# Patient Record
Sex: Female | Born: 2000 | Race: Black or African American | Hispanic: No | Marital: Single | Smoking: Never smoker
Health system: Southern US, Community
[De-identification: ages and names within clinical notes are randomized; demographics above are authoritative.]

---
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
## 2022-02-22 ENCOUNTER — Encounter: Payer: Self-pay | Admitting: Emergency Medicine

## 2022-02-22 ENCOUNTER — Ambulatory Visit
Admission: EM | Admit: 2022-02-22 | Discharge: 2022-02-22 | Disposition: A | Discharge status: Home / Self Care | Payer: Medicaid Other | Attending: Internal Medicine | Admitting: Internal Medicine

## 2022-02-22 DIAGNOSIS — Z113 Encounter for screening for infections with a predominantly sexual mode of transmission: Secondary | ICD-10-CM | POA: Diagnosis not present

## 2022-02-22 DIAGNOSIS — Z3202 Encounter for pregnancy test, result negative: Secondary | ICD-10-CM | POA: Diagnosis not present

## 2022-02-22 DIAGNOSIS — N898 Other specified noninflammatory disorders of vagina: Secondary | ICD-10-CM | POA: Insufficient documentation

## 2022-02-22 LAB — POCT URINALYSIS DIP (MANUAL ENTRY)
Bilirubin, UA: NEGATIVE
Blood, UA: NEGATIVE
Glucose, UA: NEGATIVE mg/dL
Ketones, POC UA: NEGATIVE mg/dL
Nitrite, UA: NEGATIVE
Protein Ur, POC: NEGATIVE mg/dL
Spec Grav, UA: 1.02 (ref 1.010–1.025)
Urobilinogen, UA: 0.2 E.U./dL
pH, UA: 6.5 (ref 5.0–8.0)

## 2022-02-22 LAB — POCT URINE PREGNANCY: Preg Test, Ur: NEGATIVE

## 2022-02-22 NOTE — ED Triage Notes (Signed)
Patient c/o vaginal discharge, requesting STI check.  Requesting bloodwork as well. ?

## 2022-02-22 NOTE — ED Provider Notes (Signed)
?[facility] ? ? ? ?CSN: [id number] ?Arrival date & time: 02/22/22  1437 ? ? ?  ? ?History   ?Chief Complaint ?Chief Complaint  ?Patient presents with  ? Exposure to STD  ? ? ?HPI ?Teresa Alexander is a 21 y.o. female.  ? ?Patient presents for white vaginal discharge that has been present for approximately 2 weeks.  Patient reports that she has a similar discharge around her menstrual cycles.  Last menstrual cycle was approximately 2 weeks ago.  Denies any associated urinary burning, urinary frequency, abdominal pain, fever, back pain, abnormal vaginal bleeding, hematuria.  Patient recently tested positive for gonorrhea at the end of April and was treated with Rocephin IM injection.  Patient is not sure if those symptoms have resolved.  Denies any new confirmed exposure to STD or any new sexual partners.  Patient is requesting blood work for HIV and syphilis as well. ? ? ?Exposure to STD ? ? ?History reviewed. No pertinent past medical history. ? ?There are no problems to display for this patient. ? ? ?History reviewed. No pertinent surgical history. ? ?OB History   ?No obstetric history on file. ?  ? ? ? ?Home Medications   ? ?Prior to Admission medications   ?Medication Sig Start Date End Date Taking? Authorizing Provider  ?fluticasone (FLONASE) 50 MCG/ACT nasal spray Place into the nose. 02/13/22  Yes [provider]  ?loratadine (CLARITIN) 10 MG tablet TAKE 1 TABLET BY MOUTH AT BEDTIME AS NEEDED FOR ALLERGIES. 02/13/22  Yes [provider]  ?silver sulfADIAZINE (SILVADENE) 1 % cream Apply 1 application topically daily. 09/20/20   Wieters, Hallie C, PA-C  ? ? ?Family History ?Family History  ?Problem Relation Age of Onset  ? Healthy Mother   ? ? ?Social History ?Social History  ? ?Tobacco Use  ? Smoking status: Never  ? Smokeless tobacco: Never  ?Vaping Use  ? Vaping Use: Every day  ?Substance Use Topics  ? Alcohol use: Never  ? Drug use: Yes  ?  Types: Marijuana  ? ? ? ?Allergies    ?Patient has no known allergies. ? ? ?Review of Systems ?Review of Systems ?Per HPI ? ?Physical Exam ?Triage Vital Signs ?ED Triage Vitals  ?Enc Vitals Group  ?   BP 02/22/22 1458 112/69  ?   Pulse Rate 02/22/22 1458 (!) 102  ?   Resp 02/22/22 1458 18  ?   Temp 02/22/22 1458 98 ?F (36.7 ?C)  ?   Temp Source 02/22/22 1458 Oral  ?   SpO2 02/22/22 1458 97 %  ?   Weight 02/22/22 1500 153 lb (69.4 kg)  ?   Height --   ?   Head Circumference --   ?   Peak Flow --   ?   Pain Score 02/22/22 1500 0  ?   Pain Loc --   ?   Pain Edu? --   ?   Excl. in GC? --   ? ?No data found. ? ?Updated Vital Signs ?BP 112/69 (BP Location: Left Arm)   Pulse (!) 102   Temp 98 ?F (36.7 ?C) (Oral)   Resp 18   Wt 153 lb (69.4 kg)   LMP 02/07/2022   SpO2 97%  ? ?Visual Acuity ?Right Eye Distance:   ?Left Eye Distance:   ?Bilateral Distance:   ? ?Right Eye Near:   ?Left Eye Near:    ?Bilateral Near:    ? ?Physical Exam ?Constitutional:   ?  General: She is not in acute distress. ?   Appearance: Normal appearance. She is not toxic-appearing or diaphoretic.  ?HENT:  ?   Head: Normocephalic and atraumatic.  ?Eyes:  ?   Extraocular Movements: Extraocular movements intact.  ?   Conjunctiva/sclera: Conjunctivae normal.  ?Cardiovascular:  ?   Rate and Rhythm: Normal rate and regular rhythm.  ?   Pulses: Normal pulses.  ?   Heart sounds: Normal heart sounds.  ?Pulmonary:  ?   Effort: Pulmonary effort is normal. No respiratory distress.  ?   Breath sounds: Normal breath sounds.  ?Abdominal:  ?   General: Abdomen is flat. Bowel sounds are normal. There is no distension.  ?   Palpations: Abdomen is soft.  ?   Tenderness: There is no abdominal tenderness.  ?Genitourinary: ?   Comments: Deferred with shared decision making.  Self swab performed. ?Neurological:  ?   General: No focal deficit present.  ?   Mental Status: She is alert and oriented to person, place, and time. Mental status is at baseline.  ?Psychiatric:     ?   Mood and Affect: Mood normal.      ?   Behavior: Behavior normal.     ?   Thought Content: Thought content normal.     ?   Judgment: Judgment normal.  ? ? ? ?UC Treatments / Results  ?Labs ?(all labs ordered are listed, but only abnormal results are displayed) ?Labs Reviewed  ?POCT URINALYSIS DIP (MANUAL ENTRY) - Abnormal; Notable for the following components:  ?    Result Value  ? Leukocytes, UA Moderate (2+) (*)   ? All other components within normal limits  ?URINE CULTURE  ?HIV ANTIBODY (ROUTINE TESTING W REFLEX)  ?RPR  ?POCT URINE PREGNANCY  ?CERVICOVAGINAL ANCILLARY ONLY  ? ? ?EKG ? ? ?Radiology ?No results found. ? ?Procedures ?Procedures (including critical care time) ? ?Medications Ordered in UC ?Medications - No data to display ? ?Initial Impression / Assessment and Plan / UC Course  ?I have reviewed the triage vital signs and the nursing notes. ? ?Pertinent labs & imaging results that were available during my care of the patient were reviewed by me and considered in my medical decision making (see chart for details). ? ?  ? ?Urinalysis completed by clinical staff and resulted prior to provider evaluation. Do not think it was necessary given no urinary symptoms. Urinalysis showing moderate amount of leukocytes.  Suspect this is related to possible vaginitis.  Do not think UTI is present.  Will await cervicovaginal swab results and urine culture for any further treatment at this time.  RPR and HIV also pending.  Discussed return precautions.  Patient verbalized understanding and was agreeable with plan. ?Final Clinical Impressions(s) / UC Diagnoses  ? ?Final diagnoses:  ?Vaginal discharge  ?Screening examination for venereal disease  ?Urine pregnancy test negative  ? ? ? ?Discharge Instructions   ? ?  ?Your tests are pending.  We will call if they are positive and treat as appropriate.  Please refrain from sexual activity until test results and treatment are complete. ? ? ? ?ED Prescriptions   ?None ?  ? ?PDMP not reviewed this  encounter. ?  ?Teodora Medici, [place] ?02/22/22 1528 ? ?

## 2022-02-22 NOTE — Discharge Instructions (Signed)
Your tests are pending.  We will call if they are positive and treat as appropriate.  Please refrain from sexual activity until test results and treatment are complete. ?

## 2022-02-23 LAB — RPR: RPR Ser Ql: NONREACTIVE

## 2022-02-23 LAB — URINE CULTURE: Culture: NO GROWTH

## 2022-02-23 LAB — HIV ANTIBODY (ROUTINE TESTING W REFLEX): HIV Screen 4th Generation wRfx: NONREACTIVE

## 2022-02-25 ENCOUNTER — Telehealth (HOSPITAL_COMMUNITY): Payer: Self-pay | Admitting: Emergency Medicine

## 2022-02-25 LAB — CERVICOVAGINAL ANCILLARY ONLY
Bacterial Vaginitis (gardnerella): POSITIVE — AB
Candida Glabrata: NEGATIVE
Candida Vaginitis: NEGATIVE
Chlamydia: NEGATIVE
Comment: NEGATIVE
Comment: NEGATIVE
Comment: NEGATIVE
Comment: NEGATIVE
Comment: NEGATIVE
Comment: NORMAL
Neisseria Gonorrhea: NEGATIVE
Trichomonas: NEGATIVE

## 2022-02-25 MED ORDER — METRONIDAZOLE 500 MG PO TABS: 500.0000 mg | ORAL_TABLET | Freq: Two times a day (BID) | ORAL | 0 refills | Status: DC

## 2022-03-10 ENCOUNTER — Emergency Department (HOSPITAL_BASED_OUTPATIENT_CLINIC_OR_DEPARTMENT_OTHER): Payer: Medicaid Other | Admitting: Radiology

## 2022-03-10 ENCOUNTER — Other Ambulatory Visit: Payer: Self-pay

## 2022-03-10 ENCOUNTER — Emergency Department (HOSPITAL_BASED_OUTPATIENT_CLINIC_OR_DEPARTMENT_OTHER): Payer: Medicaid Other

## 2022-03-10 ENCOUNTER — Encounter (HOSPITAL_BASED_OUTPATIENT_CLINIC_OR_DEPARTMENT_OTHER): Payer: Self-pay | Admitting: Emergency Medicine

## 2022-03-10 ENCOUNTER — Emergency Department (HOSPITAL_BASED_OUTPATIENT_CLINIC_OR_DEPARTMENT_OTHER)
Admission: EM | Admit: 2022-03-10 | Discharge: 2022-03-10 | Disposition: A | Discharge status: Home / Self Care | Payer: Medicaid Other | Attending: Emergency Medicine | Admitting: Emergency Medicine

## 2022-03-10 DIAGNOSIS — R079 Chest pain, unspecified: Secondary | ICD-10-CM

## 2022-03-10 DIAGNOSIS — M542 Cervicalgia: Secondary | ICD-10-CM | POA: Insufficient documentation

## 2022-03-10 DIAGNOSIS — R0789 Other chest pain: Secondary | ICD-10-CM | POA: Diagnosis not present

## 2022-03-10 DIAGNOSIS — R519 Headache, unspecified: Secondary | ICD-10-CM | POA: Insufficient documentation

## 2022-03-10 DIAGNOSIS — Y92410 Unspecified street and highway as the place of occurrence of the external cause: Secondary | ICD-10-CM | POA: Insufficient documentation

## 2022-03-10 MED ORDER — OXYCODONE-ACETAMINOPHEN 5-325 MG PO TABS
1.0000 | ORAL_TABLET | Freq: Once | ORAL | Status: AC
  Administered 2022-03-10: 1 via ORAL
  Filled 2022-03-10: qty 1

## 2022-03-10 NOTE — ED Notes (Signed)
Pt aware urine sample needed, urine cup provided.

## 2022-03-10 NOTE — ED Notes (Signed)
Called for Pt. In waiting room. Per Registration Pt went to go sit in her Car. Pt is not seen in waiting room by Staff.

## 2022-03-10 NOTE — ED Triage Notes (Signed)
Unsure if LOC
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
## 2022-03-10 NOTE — Discharge Instructions (Addendum)
If you develop new or worsening headache, trouble breathing, chest pain, vomiting, or any other new/concerning symptoms then return to the ER for evaluation.

## 2022-03-10 NOTE — ED Provider Notes (Signed)
CT head and C-spine images viewed by myself, no fracture or dislocation or head bleed.  Chest x-ray is also clear.  At this point this seems to be more superficial injuries and likely a concussion.  She is hemodynamically stable.  Stable for discharge.  Given return precautions.   Sherwood Gambler, MD 03/10/22 [phone]
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
## 2022-03-27 ENCOUNTER — Ambulatory Visit: Payer: Medicaid Other | Admitting: Obstetrics

## 2022-04-25 ENCOUNTER — Ambulatory Visit: Payer: Medicaid Other

## 2022-04-29 ENCOUNTER — Ambulatory Visit (HOSPITAL_COMMUNITY): Payer: Medicaid Other

## 2022-05-06 ENCOUNTER — Ambulatory Visit
Admission: EM | Admit: 2022-05-06 | Discharge: 2022-05-06 | Disposition: A | Discharge status: Home / Self Care | Payer: Medicaid Other | Attending: Emergency Medicine | Admitting: Emergency Medicine

## 2022-05-06 DIAGNOSIS — R21 Rash and other nonspecific skin eruption: Secondary | ICD-10-CM | POA: Diagnosis present

## 2022-05-06 NOTE — ED Triage Notes (Signed)
Pt presents today with co of new onset of vaginal rash for 3 days pt st she is unsure if related to shaving as some have gone away.  Pt request sti check recent period no concern for pregnancy

## 2022-05-06 NOTE — Discharge Instructions (Signed)
Your vaginal swab results will post to MyChart in 2 to 3 days. Please exfoliate areas where you shave twice a week using a course washcloth.

## 2022-05-06 NOTE — ED Provider Notes (Signed)
Patient Contact: 12:54 PM (approximate)   History   vaginal rash   HPI  Teresa Alexander is a 21 y.o. female presents to the urgent care with concern for possible vaginal rash.  Patient states that she noticed small bumps after she was shaving.  She has had recent unprotected sex with a new partner.  No changes in vaginal discharge or deep dyspareunia.  No fever or chills.      Physical Exam   Triage Vital Signs: ED Triage Vitals  Enc Vitals Group     BP 05/06/22 1246 124/73     Pulse Rate 05/06/22 1246 90     Resp 05/06/22 1246 18     Temp 05/06/22 1246 97.8 F (36.6 C)     Temp src --      SpO2 05/06/22 1246 98 %     Weight --      Height --      Head Circumference --      Peak Flow --      Pain Score 05/06/22 1245 2     Pain Loc --      Pain Edu? --      Excl. in GC? --     Most recent vital signs: Vitals:   05/06/22 1246  BP: 124/73  Pulse: 90  Resp: 18  Temp: 97.8 F (36.6 C)  SpO2: 98%     General: Alert and in no acute distress. Eyes:  PERRL. EOMI. Head: No acute traumatic findings ENT:      Ears:       Nose: No congestion/rhinnorhea.      Mouth/Throat: Mucous membranes are moist. Neck: No stridor. No cervical spine tenderness to palpation. Cardiovascular:  Good peripheral perfusion Respiratory: Normal respiratory effort without tachypnea or retractions. Lungs CTAB. Good air entry to the bases with no decreased or absent breath sounds. Gastrointestinal: Bowel sounds 4 quadrants. Soft and nontender to palpation. No guarding or rigidity. No palpable masses. No distention. No CVA tenderness. Musculoskeletal: Full range of motion to all extremities.  Neurologic:  No gross focal neurologic deficits are appreciated.  Skin patient has scattered papular, flesh-colored eruptions along labia majora.    ED Results / Procedures / Treatments   Labs (all labs ordered are listed, but only abnormal results are displayed) Labs Reviewed   CERVICOVAGINAL ANCILLARY ONLY        PROCEDURES:  Critical Care performed: No  Procedures   MEDICATIONS ORDERED IN ED: Medications - No data to display   IMPRESSION / MDM / ASSESSMENT AND PLAN / ED COURSE  I reviewed the triage vital signs and the nursing notes.                              Assessment and plan Vaginal rash 21 year old female presents to the urgent care with scattered papular rash along labia majora after shaving.  Suspect folliculitis and recommended exfoliating 2 times a week prior to shaving.  Vaginal swab in process at this time.  Return precautions were given to return with new or worsening symptoms.     FINAL CLINICAL IMPRESSION(S) / ED DIAGNOSES   Final diagnoses:  Rash and nonspecific skin eruption     Rx / DC Orders   ED Discharge Orders     None        Note:  This document was prepared using Dragon voice recognition software and may include unintentional dictation errors.  Pia Mau [place], [place] 05/06/22 1256

## 2022-05-07 ENCOUNTER — Telehealth (HOSPITAL_COMMUNITY): Payer: Self-pay | Admitting: Emergency Medicine

## 2022-05-07 LAB — CERVICOVAGINAL ANCILLARY ONLY
Bacterial Vaginitis (gardnerella): POSITIVE — AB
Candida Glabrata: NEGATIVE
Candida Vaginitis: POSITIVE — AB
Chlamydia: NEGATIVE
Comment: NEGATIVE
Comment: NEGATIVE
Comment: NEGATIVE
Comment: NEGATIVE
Comment: NEGATIVE
Comment: NORMAL
Neisseria Gonorrhea: NEGATIVE
Trichomonas: NEGATIVE

## 2022-05-07 MED ORDER — METRONIDAZOLE 500 MG PO TABS: 500.0000 mg | ORAL_TABLET | Freq: Two times a day (BID) | ORAL | 0 refills | Status: DC

## 2022-05-07 MED ORDER — FLUCONAZOLE 150 MG PO TABS: 150.0000 mg | ORAL_TABLET | Freq: Once | ORAL | 0 refills | Status: AC

## 2022-06-13 ENCOUNTER — Ambulatory Visit (HOSPITAL_COMMUNITY)
Admission: EM | Admit: 2022-06-13 | Discharge: 2022-06-13 | Disposition: A | Discharge status: Home / Self Care | Payer: Medicaid Other | Attending: Internal Medicine | Admitting: Internal Medicine

## 2022-06-13 ENCOUNTER — Encounter (HOSPITAL_COMMUNITY): Payer: Self-pay | Admitting: Emergency Medicine

## 2022-06-13 DIAGNOSIS — N760 Acute vaginitis: Secondary | ICD-10-CM

## 2022-06-13 LAB — POC URINE PREG, ED: Preg Test, Ur: NEGATIVE

## 2022-06-13 MED ORDER — METRONIDAZOLE 500 MG PO TABS: 500.0000 mg | ORAL_TABLET | Freq: Two times a day (BID) | ORAL | 0 refills | Status: DC

## 2022-06-13 NOTE — ED Triage Notes (Addendum)
Patient c/o vaginal discharge x 1 week.   Patient denies dysuria, ABD pain, pelvic pain, or vaginal itching.   Patient endorses abnormal vaginal odor and " white to yellow " color".   Patient endorses onset of symptoms began after sexual intercourse.   Patient denies any known STD exposure.   Patient hasn't taken any medications for symptos.   Patient took a Plan B pill " at the beginning of the month" and is unsure of when last period was.

## 2022-06-13 NOTE — Discharge Instructions (Addendum)
Please take medications as prescribed We will call you with recommendations if labs are abnormal Safe sex practices recommended Return to urgent care if you have worsening symptoms.

## 2022-06-14 ENCOUNTER — Telehealth (HOSPITAL_COMMUNITY): Payer: Self-pay | Admitting: Emergency Medicine

## 2022-06-14 LAB — CERVICOVAGINAL ANCILLARY ONLY
Bacterial Vaginitis (gardnerella): POSITIVE — AB
Candida Glabrata: NEGATIVE
Candida Vaginitis: POSITIVE — AB
Chlamydia: NEGATIVE
Comment: NEGATIVE
Comment: NEGATIVE
Comment: NEGATIVE
Comment: NEGATIVE
Comment: NEGATIVE
Comment: NORMAL
Neisseria Gonorrhea: NEGATIVE
Trichomonas: NEGATIVE

## 2022-06-14 MED ORDER — FLUCONAZOLE 150 MG PO TABS: 150.0000 mg | ORAL_TABLET | Freq: Once | ORAL | 0 refills | Status: AC

## 2022-06-16 NOTE — ED Provider Notes (Signed)
[facility]    CSN: [id number] Arrival date & time: 06/13/22  1258      History   Chief Complaint Chief Complaint  Patient presents with   Vaginal Discharge    HPI Teresa Alexander is a 21 y.o. female comes to urgent care with 1 week history of yellowish vaginal discharge.  Patient says symptoms started insidiously and has been persistent.  No dysuria urgency or frequency.  No nausea or vomiting.  Patient has no abdominal pain.  She denies deep or superficial dyspareunia.  No known STD exposures.  No known exposure to STD.  HPI  History reviewed. No pertinent past medical history.  There are no problems to display for this patient.   History reviewed. No pertinent surgical history.  OB History   No obstetric history on file.      Home Medications    Prior to Admission medications   Medication Sig Start Date End Date Taking? Authorizing Provider  metroNIDAZOLE (FLAGYL) 500 MG tablet Take 1 tablet (500 mg total) by mouth 2 (two) times daily. 06/13/22  Yes Marion Rosenberry, Britta Mccreedy, MD    Family History Family History  Problem Relation Age of Onset   Healthy Mother     Social History Social History   Tobacco Use   Smoking status: Never   Smokeless tobacco: Never  Vaping Use   Vaping Use: Every day  Substance Use Topics   Alcohol use: Never   Drug use: Yes    Types: Marijuana     Allergies   Patient has no known allergies.   Review of Systems Review of Systems  Respiratory: Negative.    Gastrointestinal: Negative.   Genitourinary:  Positive for vaginal discharge. Negative for dysuria and urgency.     Physical Exam Triage Vital Signs ED Triage Vitals  Enc Vitals Group     BP 06/13/22 1327 112/70     Pulse Rate 06/13/22 1327 74     Resp 06/13/22 1327 12     Temp 06/13/22 1327 98.5 F (36.9 C)     Temp Source 06/13/22 1327 Oral     SpO2 06/13/22 1327 100 %     Weight --      Height --      Head Circumference --      Peak Flow --       Pain Score 06/13/22 1331 0     Pain Loc --      Pain Edu? --      Excl. in GC? --    No data found.  Updated Vital Signs BP 112/70 (BP Location: Left Arm)   Pulse 74   Temp 98.5 F (36.9 C) (Oral)   Resp 12   LMP  (LMP Unknown)   SpO2 100%   Visual Acuity Right Eye Distance:   Left Eye Distance:   Bilateral Distance:    Right Eye Near:   Left Eye Near:    Bilateral Near:     Physical Exam Vitals and nursing note reviewed.  Constitutional:      General: She is not in acute distress.    Appearance: She is not ill-appearing.  Cardiovascular:     Rate and Rhythm: Normal rate and regular rhythm.  Abdominal:     General: Bowel sounds are normal.     Palpations: Abdomen is soft.  Neurological:     Mental Status: She is alert.      UC Treatments / Results  Labs (all labs ordered are  listed, but only abnormal results are displayed) Labs Reviewed  CERVICOVAGINAL ANCILLARY ONLY - Abnormal; Notable for the following components:      Result Value   Bacterial Vaginitis (gardnerella) Positive (*)    Candida Vaginitis Positive (*)    All other components within normal limits  POC URINE PREG, ED    EKG   Radiology No results found.  Procedures Procedures (including critical care time)  Medications Ordered in UC Medications - No data to display  Initial Impression / Assessment and Plan / UC Course  I have reviewed the triage vital signs and the nursing notes.  Pertinent labs & imaging results that were available during my care of the patient were reviewed by me and considered in my medical decision making (see chart for details).     1.  Acute vaginitis: Flagyl 500 mg twice daily for 7 days Cervicovaginal swab for GC/chlamydia/bacterial vaginosis/ We will call patient with recommendations if labs are abnormal questions given Return precautions given. Final Clinical Impressions(s) / UC Diagnoses   Final diagnoses:  Acute vaginitis     Discharge  Instructions      Please take medications as prescribed We will call you with recommendations if labs are abnormal Safe sex practices recommended Return to urgent care if you have worsening symptoms.   ED Prescriptions     Medication Sig Dispense Auth. Provider   metroNIDAZOLE (FLAGYL) 500 MG tablet Take 1 tablet (500 mg total) by mouth 2 (two) times daily. 14 tablet Brylie Sneath, Britta Mccreedy, MD      PDMP not reviewed this encounter.   Merrilee Jansky, MD 06/16/22 1037

## 2022-06-27 ENCOUNTER — Ambulatory Visit: Payer: Medicaid Other | Admitting: Obstetrics and Gynecology
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
## 2022-09-14 ENCOUNTER — Ambulatory Visit: Payer: Medicaid Other

## 2023-05-13 ENCOUNTER — Inpatient Hospital Stay (HOSPITAL_COMMUNITY)
Admission: RE | Admit: 2023-05-13 | Discharge: 2023-05-13 | Disposition: A | Discharge status: Home / Self Care | Payer: Self-pay | Source: Ambulatory Visit | Attending: Family Medicine | Admitting: Family Medicine

## 2023-05-22 ENCOUNTER — Encounter (HOSPITAL_COMMUNITY): Payer: Self-pay

## 2023-05-22 ENCOUNTER — Ambulatory Visit (HOSPITAL_COMMUNITY)
Admission: RE | Admit: 2023-05-22 | Discharge: 2023-05-22 | Disposition: A | Discharge status: Home / Self Care | Payer: Medicaid Other | Source: Ambulatory Visit | Attending: Family Medicine | Admitting: Family Medicine

## 2023-05-22 VITALS — BP 127/82 | HR 81 | Temp 98.0°F | Resp 16

## 2023-05-22 DIAGNOSIS — Z113 Encounter for screening for infections with a predominantly sexual mode of transmission: Secondary | ICD-10-CM

## 2023-05-22 DIAGNOSIS — N898 Other specified noninflammatory disorders of vagina: Secondary | ICD-10-CM | POA: Diagnosis present

## 2023-05-22 MED ORDER — METRONIDAZOLE 500 MG PO TABS: 500.0000 mg | ORAL_TABLET | Freq: Two times a day (BID) | ORAL | 0 refills | Status: DC

## 2023-05-22 MED ORDER — FLUCONAZOLE 150 MG PO TABS: ORAL_TABLET | ORAL | 0 refills | Status: DC

## 2023-05-22 NOTE — ED Provider Notes (Signed)
[facility]   [id number] 05/22/23 Arrival Time: 1035  ASSESSMENT & PLAN:  1. Vaginal discharge   2. Screening for STDs (sexually transmitted diseases)    Will tx empirically for yeast/BV; reports h/o. Meds ordered this encounter  Medications   metroNIDAZOLE (FLAGYL) 500 MG tablet    Sig: Take 1 tablet (500 mg total) by mouth 2 (two) times daily.    Dispense:  14 tablet    Refill:  0   fluconazole (DIFLUCAN) 150 MG tablet    Sig: Take one tablet by mouth as a single dose. May repeat in 3 days if symptoms persist.    Dispense:  2 tablet    Refill:  0      Discharge Instructions      We have sent testing for sexually transmitted infections. We will notify you of any positive results once they are received. If required, we will prescribe any medications you might need.  Please refrain from all sexual activity for at least the next seven days.     Without s/s of PID.  Labs Reviewed  CERVICOVAGINAL ANCILLARY ONLY   Will notify of any positive results. Instructed to refrain from sexual activity for at least seven days.  Reviewed expectations re: course of current medical issues. Questions answered. Outlined signs and symptoms indicating need for more acute intervention. Patient verbalized understanding. After Visit Summary given.   SUBJECTIVE:  Teresa Alexander is a 22 y.o. female who presents with complaint of vaginal discharge. Pt states she has had a watery discharge x 1 week no other sx. She would like STI testing. States she uses condoms sometimes One female sexual partner.  Patient's last menstrual period was 05/11/2023 (approximate).   OBJECTIVE:  Vitals:   05/22/23 1051  BP: 127/82  Pulse: 81  Resp: 16  Temp: 98 F (36.7 C)  TempSrc: Oral  SpO2: 98%     General appearance: alert, cooperative, appears stated age and no distress Lungs: unlabored respirations; speaks full sentences without difficulty Back: no CVA tenderness; FROM at  waist Abdomen: soft, non-tender GU: deferred Skin: warm and dry Psychological: alert and cooperative; normal mood and affect.   Labs Reviewed  CERVICOVAGINAL ANCILLARY ONLY    No Known Allergies  History reviewed. No pertinent past medical history. Family History  Problem Relation Age of Onset   Healthy Mother    Social History   Socioeconomic History   Marital status: Single    Spouse name: Not on file   Number of children: Not on file   Years of education: Not on file   Highest education level: Not on file  Occupational History   Not on file  Tobacco Use   Smoking status: Never   Smokeless tobacco: Never  Vaping Use   Vaping status: Every Day  Substance and Sexual Activity   Alcohol use: Never   Drug use: Yes    Types: Marijuana   Sexual activity: Yes    Birth control/protection: Condom    Comment: sometimes  Other Topics Concern   Not on file  Social History Narrative   Not on file   Social Determinants of Health   Financial Resource Strain: Not on file  Food Insecurity: Not on file  Transportation Needs: Not on file  Physical Activity: Not on file  Stress: Not on file  Social Connections: Not on file  Intimate Partner Violence: Not on file           Mardella Layman, MD 05/22/23 1122

## 2023-05-22 NOTE — ED Triage Notes (Addendum)
Pt states she has had a watery discharge x 1 week no other sx. She would like STI testing. States she uses condoms sometimes

## 2023-05-22 NOTE — Discharge Instructions (Signed)
We have sent testing for sexually transmitted infections. We will notify you of any positive results once they are received. If required, we will prescribe any medications you might need.  Please refrain from all sexual activity for at least the next seven days.

## 2023-05-23 ENCOUNTER — Telehealth: Payer: Self-pay

## 2023-05-23 MED ORDER — DOXYCYCLINE HYCLATE 100 MG PO CAPS: 100.0000 mg | ORAL_CAPSULE | Freq: Two times a day (BID) | ORAL | 0 refills | Status: DC

## 2023-05-23 NOTE — Telephone Encounter (Signed)
Contacted patient by phone.  Verified identity using two identifiers.  Provided positive result.  Reviewed safe sex practices, notifying partners, and refraining from sexual activities for 7 days from time of treatment.  Patient verified understanding, all questions answered.   Pt requires tx with Doxycycline. Reviewed with patient, verified pharmacy, prescription sent.

## 2023-06-09 ENCOUNTER — Ambulatory Visit (HOSPITAL_COMMUNITY): Payer: Medicaid Other

## 2023-06-10 ENCOUNTER — Ambulatory Visit (HOSPITAL_COMMUNITY): Payer: Medicaid Other

## 2023-06-18 ENCOUNTER — Encounter (HOSPITAL_COMMUNITY): Payer: Self-pay | Admitting: Emergency Medicine

## 2023-06-18 ENCOUNTER — Ambulatory Visit (HOSPITAL_COMMUNITY)
Admission: EM | Admit: 2023-06-18 | Discharge: 2023-06-18 | Disposition: A | Discharge status: Home / Self Care | Payer: Medicaid Other | Attending: Family Medicine | Admitting: Family Medicine

## 2023-06-18 DIAGNOSIS — Z113 Encounter for screening for infections with a predominantly sexual mode of transmission: Secondary | ICD-10-CM

## 2023-06-18 NOTE — ED Triage Notes (Signed)
Patient states that she tested positive for chlamydia earlier this month, completed course of medication.  Partner was treated.  Patient would like to be retested to make sure everything is cleared up.

## 2023-06-19 NOTE — ED Provider Notes (Signed)
  [facility]   [id number] 06/18/23 Arrival Time: 1734  ASSESSMENT & PLAN:  1. Screening for STDs (sexually transmitted diseases)    Without s/s of PID. No empiric tx.  Labs Reviewed  CERVICOVAGINAL ANCILLARY ONLY   Will notify of any positive results. Instructed to refrain from sexual activity for at least seven days.  Reviewed expectations re: course of current medical issues. Questions answered. Outlined signs and symptoms indicating need for more acute intervention. Patient verbalized understanding. After Visit Summary given.   SUBJECTIVE:  Teresa Alexander is a 22 y.o. female who reports that she tested positive for chlamydia earlier this month, completed course of medication.  Partner was treated.  Patient would like to be retested to make sure everything is cleared up.  Patient's last menstrual period was 06/14/2023 (approximate).   OBJECTIVE:  Vitals:   06/18/23 1825 06/18/23 1826  BP: 126/83   Pulse: (!) 59   Resp: 16   Temp: 98.4 F (36.9 C)   TempSrc: Oral   SpO2: 98%   Weight:  74.8 kg  Height:  5\' 5"  (1.651 m)    General appearance: alert, cooperative, appears stated age and no distress Lungs: unlabored respirations; speaks full sentences without difficulty Back: no CVA tenderness; FROM at waist Abdomen: soft, non-tender GU: deferred Skin: warm and dry Psychological: alert and cooperative; normal mood and affect.  Labs Reviewed  CERVICOVAGINAL ANCILLARY ONLY    No Known Allergies  History reviewed. No pertinent past medical history. Family History  Problem Relation Age of Onset   Healthy Mother    Social History   Socioeconomic History   Marital status: Single    Spouse name: Not on file   Number of children: Not on file   Years of education: Not on file   Highest education level: Not on file  Occupational History   Not on file  Tobacco Use   Smoking status: Never   Smokeless tobacco: Never  Vaping Use   Vaping  status: Every Day  Substance and Sexual Activity   Alcohol use: Never   Drug use: Yes    Types: Marijuana   Sexual activity: Yes    Birth control/protection: Condom    Comment: sometimes  Other Topics Concern   Not on file  Social History Narrative   Not on file   Social Determinants of Health   Financial Resource Strain: Not on file  Food Insecurity: Not on file  Transportation Needs: Not on file  Physical Activity: Not on file  Stress: Not on file  Social Connections: Not on file  Intimate Partner Violence: Not on file           Mardella Layman, MD 06/19/23 1007

## 2023-06-20 ENCOUNTER — Telehealth: Payer: Self-pay

## 2023-06-20 MED ORDER — METRONIDAZOLE 500 MG PO TABS: 500.0000 mg | ORAL_TABLET | Freq: Two times a day (BID) | ORAL | 0 refills | Status: AC

## 2023-06-20 NOTE — Telephone Encounter (Signed)
Per protocol, pt requires tx with metronidazole. Reviewed with patient, verified pharmacy, prescription sent.

## 2023-06-26 LAB — CERVICOVAGINAL ANCILLARY ONLY
Bacterial Vaginitis (gardnerella): POSITIVE — AB
Candida Glabrata: NEGATIVE
Candida Vaginitis: NEGATIVE
Chlamydia: NEGATIVE
Comment: NEGATIVE
Comment: NEGATIVE
Comment: NEGATIVE
Comment: NEGATIVE
Comment: NEGATIVE
Comment: NORMAL
Neisseria Gonorrhea: NEGATIVE
Trichomonas: NEGATIVE

## 2023-07-02 ENCOUNTER — Other Ambulatory Visit: Payer: Self-pay

## 2023-07-02 ENCOUNTER — Ambulatory Visit (HOSPITAL_COMMUNITY)
Admission: RE | Admit: 2023-07-02 | Discharge: 2023-07-02 | Disposition: A | Discharge status: Home / Self Care | Payer: Medicaid Other | Source: Ambulatory Visit | Attending: Emergency Medicine | Admitting: Emergency Medicine

## 2023-07-02 ENCOUNTER — Encounter (HOSPITAL_COMMUNITY): Payer: Self-pay

## 2023-07-02 VITALS — BP 118/80 | HR 66 | Temp 98.3°F | Resp 18

## 2023-07-02 DIAGNOSIS — Z113 Encounter for screening for infections with a predominantly sexual mode of transmission: Secondary | ICD-10-CM | POA: Diagnosis present

## 2023-07-02 DIAGNOSIS — N898 Other specified noninflammatory disorders of vagina: Secondary | ICD-10-CM | POA: Insufficient documentation

## 2023-07-02 LAB — POCT URINE PREGNANCY: Preg Test, Ur: NEGATIVE

## 2023-07-02 NOTE — ED Triage Notes (Signed)
Pt has had a Vag discharge for one week.

## 2023-07-02 NOTE — ED Provider Notes (Signed)
[facility]    CSN: [id number] Arrival date & time: 07/02/23  1116      History   Chief Complaint Chief Complaint  Patient presents with   SEXUALLY TRANSMITTED DISEASE   Vaginal Discharge    HPI Teresa Alexander is a 22 y.o. female.  Here with 1 week history of vaginal discharge Denies odor or itching  Unknown if exposure to STD. Recently had chlamydia. Reports frequent BV Denies abdominal pain, rash or lesions, dysuria  LMP 8/24  History reviewed. No pertinent past medical history.  There are no problems to display for this patient.   History reviewed. No pertinent surgical history.  OB History   No obstetric history on file.      Home Medications    Prior to Admission medications   Not on File    Family History Family History  Problem Relation Age of Onset   Healthy Mother     Social History Social History   Tobacco Use   Smoking status: Never   Smokeless tobacco: Never  Vaping Use   Vaping status: Every Day  Substance Use Topics   Alcohol use: Never   Drug use: Yes    Types: Marijuana     Allergies   Patient has no known allergies.   Review of Systems Review of Systems  Genitourinary:  Positive for vaginal discharge.   Per HPI  Physical Exam Triage Vital Signs ED Triage Vitals  Encounter Vitals Group     BP 07/02/23 1214 118/80     Systolic BP Percentile --      Diastolic BP Percentile --      Pulse Rate 07/02/23 1214 66     Resp 07/02/23 1214 18     Temp 07/02/23 1214 98.3 F (36.8 C)     Temp src --      SpO2 07/02/23 1214 98 %     Weight --      Height --      Head Circumference --      Peak Flow --      Pain Score 07/02/23 1212 0     Pain Loc --      Pain Education --      Exclude from Growth Chart --    No data found.  Updated Vital Signs BP 118/80   Pulse 66   Temp 98.3 F (36.8 C)   Resp 18   LMP 06/14/2023 (Approximate)   SpO2 98%   Visual Acuity Right Eye Distance:   Left Eye  Distance:   Bilateral Distance:    Right Eye Near:   Left Eye Near:    Bilateral Near:     Physical Exam Vitals and nursing note reviewed.  Constitutional:      General: She is not in acute distress.    Appearance: Normal appearance.  Cardiovascular:     Rate and Rhythm: Normal rate and regular rhythm.     Heart sounds: Normal heart sounds.  Pulmonary:     Effort: Pulmonary effort is normal.     Breath sounds: Normal breath sounds.  Neurological:     Mental Status: She is alert and oriented to person, place, and time.      UC Treatments / Results  Labs (all labs ordered are listed, but only abnormal results are displayed) Labs Reviewed  POCT URINE PREGNANCY  CERVICOVAGINAL ANCILLARY ONLY    EKG   Radiology No results found.  Procedures Procedures (including critical care time)  Medications  Ordered in UC Medications - No data to display  Initial Impression / Assessment and Plan / UC Course  I have reviewed the triage vital signs and the nursing notes.  Pertinent labs & imaging results that were available during my care of the patient were reviewed by me and considered in my medical decision making (see chart for details).  UPT negative Cytology swab pending Provided with OB/GYN clinic for follow-up. All questions answered. Work note provided. Return if needed  Final Clinical Impressions(s) / UC Diagnoses   Final diagnoses:  Vaginal discharge  Screen for STD (sexually transmitted disease)     Discharge Instructions      We will call you if anything on your swab returns positive. You can also see these results on MyChart once you set up an account. Please abstain from sexual intercourse until your results return.  Call the OB/GYN clinic to make an appointment for follow up     ED Prescriptions   None    PDMP not reviewed this encounter.   Marlow Baars, Cordelia Poche 07/02/23 1314

## 2023-07-02 NOTE — Discharge Instructions (Addendum)
We will call you if anything on your swab returns positive. You can also see these results on MyChart once you set up an account. Please abstain from sexual intercourse until your results return.  Call the OB/GYN clinic to make an appointment for follow up

## 2023-07-03 LAB — CERVICOVAGINAL ANCILLARY ONLY
Bacterial Vaginitis (gardnerella): POSITIVE — AB
Candida Glabrata: NEGATIVE
Candida Vaginitis: NEGATIVE
Chlamydia: NEGATIVE
Comment: NEGATIVE
Comment: NEGATIVE
Comment: NEGATIVE
Comment: NEGATIVE
Comment: NEGATIVE
Comment: NORMAL
Neisseria Gonorrhea: NEGATIVE
Trichomonas: NEGATIVE

## 2023-07-04 ENCOUNTER — Encounter (HOSPITAL_COMMUNITY): Payer: Self-pay

## 2023-07-04 ENCOUNTER — Ambulatory Visit (HOSPITAL_COMMUNITY)
Admission: RE | Admit: 2023-07-04 | Discharge: 2023-07-04 | Disposition: A | Discharge status: Home / Self Care | Payer: Medicaid Other | Source: Ambulatory Visit | Attending: Emergency Medicine | Admitting: Emergency Medicine

## 2023-07-04 VITALS — BP 117/77 | HR 84 | Temp 98.1°F | Resp 16

## 2023-07-04 DIAGNOSIS — R350 Frequency of micturition: Secondary | ICD-10-CM | POA: Diagnosis not present

## 2023-07-04 DIAGNOSIS — N760 Acute vaginitis: Secondary | ICD-10-CM | POA: Diagnosis present

## 2023-07-04 DIAGNOSIS — B9689 Other specified bacterial agents as the cause of diseases classified elsewhere: Secondary | ICD-10-CM

## 2023-07-04 LAB — POCT URINALYSIS DIP (MANUAL ENTRY)
Bilirubin, UA: NEGATIVE
Blood, UA: NEGATIVE
Glucose, UA: NEGATIVE mg/dL
Ketones, POC UA: NEGATIVE mg/dL
Nitrite, UA: NEGATIVE
Protein Ur, POC: NEGATIVE mg/dL
Spec Grav, UA: >=1.03 — AB (ref 1.010–1.025)
Urobilinogen, UA: 1 U/dL
pH, UA: 6.5 (ref 5.0–8.0)

## 2023-07-04 MED ORDER — CLINDAMYCIN HCL 300 MG PO CAPS: 300.0000 mg | ORAL_CAPSULE | Freq: Two times a day (BID) | ORAL | 0 refills | Status: AC

## 2023-07-04 NOTE — ED Triage Notes (Signed)
Pt states urinary frequency for the past month.  Denies any other urinary symptoms.

## 2023-07-04 NOTE — Discharge Instructions (Signed)
Start taking antibiotic as prescribed until finished. I have attached a different OBGYN to follow-up with regarding recurrent infections.

## 2023-07-04 NOTE — ED Provider Notes (Signed)
[facility]    CSN: [id number] Arrival date & time: 07/04/23  1258      History   Chief Complaint Chief Complaint  Patient presents with   Urinary Frequency    HPI Teresa Alexander is a 22 y.o. female.   Patient presents with urinary frequency x 1 month. Denies painful urination, hematuria, back pain, fever, vaginal discharge, and vaginal bleeding. Patient was seen 2 days ago for similar symptoms.   Urinary Frequency Pertinent negatives include no abdominal pain.    History reviewed. No pertinent past medical history.  There are no problems to display for this patient.   History reviewed. No pertinent surgical history.  OB History   No obstetric history on file.      Home Medications    Prior to Admission medications   Medication Sig Start Date End Date Taking? Authorizing Provider  clindamycin (CLEOCIN) 300 MG capsule Take 1 capsule (300 mg total) by mouth 2 (two) times daily for 7 days. 07/04/23 07/11/23 Yes Letta Kocher, NP    Family History Family History  Problem Relation Age of Onset   Healthy Mother     Social History Social History   Tobacco Use   Smoking status: Never   Smokeless tobacco: Never  Vaping Use   Vaping status: Every Day  Substance Use Topics   Alcohol use: Never   Drug use: Yes    Types: Marijuana     Allergies   Patient has no known allergies.   Review of Systems Review of Systems  Constitutional:  Negative for chills, fatigue and fever.  Gastrointestinal:  Negative for abdominal pain, diarrhea, nausea and vomiting.  Genitourinary:  Positive for frequency. Negative for difficulty urinating, dysuria, flank pain, hematuria, menstrual problem, pelvic pain, urgency, vaginal bleeding, vaginal discharge and vaginal pain.  Skin:  Negative for color change.     Physical Exam Triage Vital Signs ED Triage Vitals  Encounter Vitals Group     BP 07/04/23 1317 117/77     Systolic BP Percentile --       Diastolic BP Percentile --      Pulse Rate 07/04/23 1316 84     Resp 07/04/23 1316 16     Temp 07/04/23 1316 98.1 F (36.7 C)     Temp Source 07/04/23 1316 Oral     SpO2 07/04/23 1316 98 %     Weight --      Height --      Head Circumference --      Peak Flow --      Pain Score 07/04/23 1317 0     Pain Loc --      Pain Education --      Exclude from Growth Chart --    No data found.  Updated Vital Signs BP 117/77 (BP Location: Right Arm)   Pulse 84   Temp 98.1 F (36.7 C) (Oral)   Resp 16   LMP 06/14/2023 (Approximate)   SpO2 98%   Visual Acuity Right Eye Distance:   Left Eye Distance:   Bilateral Distance:    Right Eye Near:   Left Eye Near:    Bilateral Near:     Physical Exam Vitals and nursing note reviewed.  Constitutional:      General: She is not in acute distress.    Appearance: Normal appearance. She is not ill-appearing, toxic-appearing or diaphoretic.  Abdominal:     General: Abdomen is flat. There is no distension.  Palpations: There is no mass.     Tenderness: There is no abdominal tenderness. There is no right CVA tenderness, left CVA tenderness, guarding or rebound.  Genitourinary:    Comments: Exam deferred.  Skin:    General: Skin is warm and dry.  Neurological:     Mental Status: She is alert.      UC Treatments / Results  Labs (all labs ordered are listed, but only abnormal results are displayed) Labs Reviewed  POCT URINALYSIS DIP (MANUAL ENTRY) - Abnormal; Notable for the following components:      Result Value   Color, UA straw (*)    Spec Grav, UA >=1.030 (*)    Leukocytes, UA Small (1+) (*)    All other components within normal limits  URINE CULTURE    EKG   Radiology No results found.  Procedures Procedures (including critical care time)  Medications Ordered in UC Medications - No data to display  Initial Impression / Assessment and Plan / UC Course  I have reviewed the triage vital signs and the nursing  notes.  Pertinent labs & imaging results that were available during my care of the patient were reviewed by me and considered in my medical decision making (see chart for details).     Patient presented with urinary frequency x 1 month.  Denies painful urination, hematuria, back pain, fever, vaginal discharge, and vaginal bleeding.  Patient states she was seen 2 days ago for similar symptoms and STD screening.  UA revealed small leukocytes, will send culture. No significant findings upon assessment. Swab revealed bacterial vaginitis. Patient recently finished Flagyl for bacterial vaginitis. Prescribed clindamycin recommended follow-up with OB/GYN for recurrent infections. Discussed return precautions. Final Clinical Impressions(s) / UC Diagnoses   Final diagnoses:  Urinary frequency  Bacterial vaginitis     Discharge Instructions      Start taking antibiotic as prescribed until finished. I have attached a different OBGYN to follow-up with regarding recurrent infections.     ED Prescriptions     Medication Sig Dispense Auth. Provider   clindamycin (CLEOCIN) 300 MG capsule Take 1 capsule (300 mg total) by mouth 2 (two) times daily for 7 days. 14 capsule Wynonia Lawman A, NP      PDMP not reviewed this encounter.   Wynonia Lawman A, NP 07/04/23 1353

## 2023-07-07 LAB — URINE CULTURE: Culture: 90000 — AB

## 2023-07-30 ENCOUNTER — Ambulatory Visit (HOSPITAL_COMMUNITY): Payer: Medicaid Other

## 2023-08-09 ENCOUNTER — Encounter: Payer: Self-pay | Admitting: Emergency Medicine

## 2023-08-09 ENCOUNTER — Ambulatory Visit
Admission: EM | Admit: 2023-08-09 | Discharge: 2023-08-09 | Disposition: A | Discharge status: Home / Self Care | Payer: Medicaid Other | Attending: Internal Medicine | Admitting: Internal Medicine

## 2023-08-09 DIAGNOSIS — Z113 Encounter for screening for infections with a predominantly sexual mode of transmission: Secondary | ICD-10-CM | POA: Diagnosis not present

## 2023-08-09 DIAGNOSIS — Z3202 Encounter for pregnancy test, result negative: Secondary | ICD-10-CM | POA: Diagnosis not present

## 2023-08-09 DIAGNOSIS — N898 Other specified noninflammatory disorders of vagina: Secondary | ICD-10-CM | POA: Insufficient documentation

## 2023-08-09 LAB — POCT URINE PREGNANCY: Preg Test, Ur: NEGATIVE

## 2023-08-09 NOTE — ED Triage Notes (Signed)
Patient c/o vaginal itching x 2 week, white vaginal discharge w/odor.  Requesting STI testing.  Denies any OTC meds.

## 2023-08-09 NOTE — ED Provider Notes (Signed)
[facility]    CSN: [id number] Arrival date & time: 08/09/23  1258      History   Chief Complaint Chief Complaint  Patient presents with   Vaginal Itching    HPI YERALDIN PROSS is a 22 y.o. female.   Patient presents with vaginal itching, vaginal irritation, white vaginal discharge that started about 2 weeks ago.  Patient denies exposure to STD but has had unprotected sexual intercourse prior to symptoms starting.  Last menstrual cycle was at the end of September and patient has been having monthly menstrual cycles.  Patient denies dysuria, urinary frequency, abdominal pain, pelvic pain, hematuria, abnormal vaginal bleeding.  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   Vaginal Itching    History reviewed. No pertinent past medical history.  There are no problems to display for this patient.   History reviewed. No pertinent surgical history.  OB History   No obstetric history on file.      Home Medications    Prior to Admission medications   Not on File    Family History Family History  Problem Relation Age of Onset   Healthy Mother     Social History Social History   Tobacco Use   Smoking status: Never   Smokeless tobacco: Never  Vaping Use   Vaping status: Every Day  Substance Use Topics   Alcohol use: Never   Drug use: Yes    Types: Marijuana     Allergies   Patient has no known allergies.   Review of Systems Review of Systems Per HPI  Physical Exam Triage Vital Signs ED Triage Vitals  Encounter Vitals Group     BP 08/09/23 1357 114/74     Systolic BP Percentile --      Diastolic BP Percentile --      Pulse Rate 08/09/23 1357 72     Resp 08/09/23 1357 16     Temp 08/09/23 1357 98.1 F (36.7 C)     Temp Source 08/09/23 1357 Oral     SpO2 08/09/23 1357 98 %     Weight 08/09/23 1359 164 lb (74.4 kg)     Height 08/09/23 1359 5\' 5"  (1.651 m)     Head Circumference --      Peak Flow --       Pain Score 08/09/23 1358 0     Pain Loc --      Pain Education --      Exclude from Growth Chart --    No data found.  Updated Vital Signs BP 114/74 (BP Location: Left Arm)   Pulse 72   Temp 98.1 F (36.7 C) (Oral)   Resp 16   Ht 5\' 5"  (1.651 m)   Wt 164 lb (74.4 kg)   LMP 07/15/2023   SpO2 98%   BMI 27.29 kg/m   Visual Acuity Right Eye Distance:   Left Eye Distance:   Bilateral Distance:    Right Eye Near:   Left Eye Near:    Bilateral Near:     Physical Exam Constitutional:      General: She is not in acute distress.    Appearance: Normal appearance. She is not toxic-appearing or diaphoretic.  HENT:     Head: Normocephalic and atraumatic.  Eyes:     Extraocular Movements: Extraocular movements intact.     Conjunctiva/sclera: Conjunctivae normal.  Pulmonary:     Effort: Pulmonary effort is normal.  Genitourinary:    Comments: Deferred with  shared decision making. Self swab performed.  Neurological:     General: No focal deficit present.     Mental Status: She is alert and oriented to person, place, and time. Mental status is at baseline.  Psychiatric:        Mood and Affect: Mood normal.        Behavior: Behavior normal.        Thought Content: Thought content normal.        Judgment: Judgment normal.      UC Treatments / Results  Labs (all labs ordered are listed, but only abnormal results are displayed) Labs Reviewed  POCT URINE PREGNANCY  CERVICOVAGINAL ANCILLARY ONLY    EKG   Radiology No results found.  Procedures Procedures (including critical care time)  Medications Ordered in UC Medications - No data to display  Initial Impression / Assessment and Plan / UC Course  I have reviewed the triage vital signs and the nursing notes.  Pertinent labs & imaging results that were available during my care of the patient were reviewed by me and considered in my medical decision making (see chart for details).     Urine pregnancy test  negative.  Cervicovaginal swab pending.  Given no confirmed exposure to STD, will await swab prior to treatment.  Most likely BV given patient's recurrent history.  Given patient has history of recurrent BV, recommended that she follow-up with gynecologist.  Advised strict follow-up precautions.  Patient verbalized understanding and was agreeable with plan. Final Clinical Impressions(s) / UC Diagnoses   Final diagnoses:  Vaginal itching  Vaginal discharge  Screening examination for venereal disease  Urine pregnancy test negative     Discharge Instructions      Pregnancy test was negative.  Vaginal swab pending.  Will call if it is abnormal.    ED Prescriptions   None    PDMP not reviewed this encounter.   Gustavus Bryant, [place] 08/09/23 1440

## 2023-08-09 NOTE — Discharge Instructions (Addendum)
Pregnancy test was negative.  Vaginal swab pending.  Will call if it is abnormal.

## 2023-08-11 LAB — CERVICOVAGINAL ANCILLARY ONLY
Bacterial Vaginitis (gardnerella): POSITIVE — AB
Candida Glabrata: NEGATIVE
Candida Vaginitis: NEGATIVE
Chlamydia: NEGATIVE
Comment: NEGATIVE
Comment: NEGATIVE
Comment: NEGATIVE
Comment: NEGATIVE
Comment: NEGATIVE
Comment: NORMAL
Neisseria Gonorrhea: NEGATIVE
Trichomonas: NEGATIVE

## 2023-08-12 ENCOUNTER — Telehealth: Payer: Self-pay

## 2023-08-12 MED ORDER — METRONIDAZOLE 0.75 % VA GEL: 1.0000 | Freq: Every day | VAGINAL | 0 refills | Status: AC

## 2023-08-12 MED ORDER — METRONIDAZOLE 500 MG PO TABS: 500.0000 mg | ORAL_TABLET | Freq: Two times a day (BID) | ORAL | 0 refills | Status: AC

## 2023-08-12 NOTE — Telephone Encounter (Signed)
 Per protocol, pt requires tx with metronidazole. Attempted to reach patient x1. LVM. Rx sent to pharmacy on file.

## 2023-08-12 NOTE — Telephone Encounter (Signed)
Pt returned call. Metrogel sent per request.

## 2023-09-12 ENCOUNTER — Ambulatory Visit (HOSPITAL_COMMUNITY): Payer: Medicaid Other

## 2023-09-25 ENCOUNTER — Ambulatory Visit (HOSPITAL_COMMUNITY)
Admission: RE | Admit: 2023-09-25 | Discharge: 2023-09-25 | Disposition: A | Discharge status: Home / Self Care | Payer: Medicaid Other | Source: Ambulatory Visit | Attending: Internal Medicine | Admitting: Internal Medicine

## 2023-09-25 ENCOUNTER — Encounter (HOSPITAL_COMMUNITY): Payer: Self-pay

## 2023-09-25 VITALS — BP 118/78 | HR 88 | Temp 98.1°F | Resp 18

## 2023-09-25 DIAGNOSIS — N898 Other specified noninflammatory disorders of vagina: Secondary | ICD-10-CM | POA: Insufficient documentation

## 2023-09-25 DIAGNOSIS — L0231 Cutaneous abscess of buttock: Secondary | ICD-10-CM | POA: Diagnosis present

## 2023-09-25 DIAGNOSIS — Z113 Encounter for screening for infections with a predominantly sexual mode of transmission: Secondary | ICD-10-CM | POA: Insufficient documentation

## 2023-09-25 LAB — HIV ANTIBODY (ROUTINE TESTING W REFLEX): HIV Screen 4th Generation wRfx: NONREACTIVE

## 2023-09-25 MED ORDER — DOXYCYCLINE HYCLATE 100 MG PO CAPS: 100.0000 mg | ORAL_CAPSULE | Freq: Two times a day (BID) | ORAL | 0 refills | Status: DC

## 2023-09-25 NOTE — ED Provider Notes (Signed)
[facility]    CSN: [id number] Arrival date & time: 09/25/23  1252      History   Chief Complaint Chief Complaint  Patient presents with   SEXUALLY TRANSMITTED DISEASE    HPI Teresa Alexander is a 22 y.o. female.   Patient presents with 2 different chief complaints today.  Reports that she has a white to yellow vaginal discharge that started a few days ago.  She does have a history of recurrent bacterial vaginosis where she was last treated in October.  She has not yet followed up with gynecology as recommended.  Denies exposure to STD but is requesting STD testing today.  Last menstrual cycle was 09/19/2023.  She is also concerned about bumps to her vaginal area that she noticed after she shaved.     History reviewed. No pertinent past medical history.  There are no problems to display for this patient.   History reviewed. No pertinent surgical history.  OB History   No obstetric history on file.      Home Medications    Prior to Admission medications   Medication Sig Start Date End Date Taking? Authorizing Provider  doxycycline (VIBRAMYCIN) 100 MG capsule Take 1 capsule (100 mg total) by mouth 2 (two) times daily. 09/25/23  Yes Stefhanie Kachmar, Acie Fredrickson, FNP    Family History Family History  Problem Relation Age of Onset   Healthy Mother     Social History Social History   Tobacco Use   Smoking status: Never   Smokeless tobacco: Never  Vaping Use   Vaping status: Every Day  Substance Use Topics   Alcohol use: Never   Drug use: Yes    Types: Marijuana     Allergies   Patient has no known allergies.   Review of Systems Review of Systems Per HPI  Physical Exam Triage Vital Signs ED Triage Vitals  Encounter Vitals Group     BP 09/25/23 1321 118/78     Systolic BP Percentile --      Diastolic BP Percentile --      Pulse Rate 09/25/23 1321 88     Resp 09/25/23 1321 18     Temp 09/25/23 1321 98.1 F (36.7 C)     Temp Source 09/25/23 1321  Oral     SpO2 09/25/23 1321 96 %     Weight --      Height --      Head Circumference --      Peak Flow --      Pain Score 09/25/23 1322 0     Pain Loc --      Pain Education --      Exclude from Growth Chart --    No data found.  Updated Vital Signs BP 118/78 (BP Location: Left Arm)   Pulse 88   Temp 98.1 F (36.7 C) (Oral)   Resp 18   LMP 09/19/2023 (Approximate)   SpO2 96%   Visual Acuity Right Eye Distance:   Left Eye Distance:   Bilateral Distance:    Right Eye Near:   Left Eye Near:    Bilateral Near:     Physical Exam Exam conducted with a chaperone present.  Constitutional:      General: She is not in acute distress.    Appearance: Normal appearance. She is not toxic-appearing or diaphoretic.  HENT:     Head: Normocephalic and atraumatic.  Eyes:     Extraocular Movements: Extraocular movements intact.  Conjunctiva/sclera: Conjunctivae normal.  Pulmonary:     Effort: Pulmonary effort is normal.  Genitourinary:      Comments: Patient has approximately 2 cm in diameter area of induration that is mildly swollen with no purulent drainage present to left lower buttocks.  No other bumps noted.  Patient performed self vaginal swab prior to this evaluation. Neurological:     General: No focal deficit present.     Mental Status: She is alert and oriented to person, place, and time. Mental status is at baseline.  Psychiatric:        Mood and Affect: Mood normal.        Behavior: Behavior normal.        Thought Content: Thought content normal.        Judgment: Judgment normal.      UC Treatments / Results  Labs (all labs ordered are listed, but only abnormal results are displayed) Labs Reviewed  RPR  HIV ANTIBODY (ROUTINE TESTING W REFLEX)  CERVICOVAGINAL ANCILLARY ONLY    EKG   Radiology No results found.  Procedures Procedures (including critical care time)  Medications Ordered in UC Medications - No data to display  Initial Impression  / Assessment and Plan / UC Course  I have reviewed the triage vital signs and the nursing notes.  Pertinent labs & imaging results that were available during my care of the patient were reviewed by me and considered in my medical decision making (see chart for details).     1.  Vaginal discharge  Cervicovaginal swab, HIV, RPR pending.  Most likely bacterial vaginosis given patient's recurrent history.  Will await swab prior to any treatment.  Patient again provided with contact information for gynecology follow-up given history of recurrent BV.  2.  Abscess to buttocks  Patient has very small abscess to left buttocks.  It does not need I&D at this time.  Will treat with doxycycline.  Patient advised of warm compresses.  Advised strict follow-up precautions for all chief complaints today.  Patient verbalized understanding and was agreeable with plan. Final Clinical Impressions(s) / UC Diagnoses   Final diagnoses:  Vaginal discharge  Screening examination for venereal disease  Abscess of left buttock     Discharge Instructions      I have prescribed an antibiotic to treat boil on your left buttocks.  Take with food to avoid stomach upset.  Vaginal swab and STD testing are pending.  Will call if it is abnormal.  Given recurrent bacterial vaginosis, please follow-up with gynecology at provided contact.    ED Prescriptions     Medication Sig Dispense Auth. Provider   doxycycline (VIBRAMYCIN) 100 MG capsule Take 1 capsule (100 mg total) by mouth 2 (two) times daily. 20 capsule Gustavus Bryant, [place]      PDMP not reviewed this encounter.   Gustavus Bryant, [place] 09/25/23 [phone]

## 2023-09-25 NOTE — ED Triage Notes (Signed)
Pt requesting STD testing. C/o thick white discharge. States has bumps to outer vagina after shaving.

## 2023-09-25 NOTE — Discharge Instructions (Signed)
I have prescribed an antibiotic to treat boil on your left buttocks.  Take with food to avoid stomach upset.  Vaginal swab and STD testing are pending.  Will call if it is abnormal.  Given recurrent bacterial vaginosis, please follow-up with gynecology at provided contact.

## 2023-09-26 LAB — CERVICOVAGINAL ANCILLARY ONLY
Bacterial Vaginitis (gardnerella): NEGATIVE
Candida Glabrata: NEGATIVE
Candida Vaginitis: NEGATIVE
Chlamydia: NEGATIVE
Comment: NEGATIVE
Comment: NEGATIVE
Comment: NEGATIVE
Comment: NEGATIVE
Comment: NEGATIVE
Comment: NORMAL
Neisseria Gonorrhea: NEGATIVE
Trichomonas: NEGATIVE

## 2023-09-26 LAB — RPR: RPR Ser Ql: NONREACTIVE

## 2023-10-01 ENCOUNTER — Telehealth (HOSPITAL_COMMUNITY): Payer: Self-pay | Admitting: *Deleted

## 2023-10-01 NOTE — Telephone Encounter (Signed)
Pt advised all test neg and she should continue antibiotics given for abscess.

## 2023-10-21 ENCOUNTER — Encounter (HOSPITAL_COMMUNITY): Payer: Self-pay

## 2023-10-21 ENCOUNTER — Ambulatory Visit (HOSPITAL_COMMUNITY)
Admission: RE | Admit: 2023-10-21 | Discharge: 2023-10-21 | Disposition: A | Discharge status: Home / Self Care | Payer: Medicaid Other | Source: Ambulatory Visit | Attending: Emergency Medicine | Admitting: Emergency Medicine

## 2023-10-21 VITALS — BP 105/64 | HR 51 | Temp 98.5°F | Resp 16

## 2023-10-21 DIAGNOSIS — Z113 Encounter for screening for infections with a predominantly sexual mode of transmission: Secondary | ICD-10-CM

## 2023-10-21 NOTE — ED Provider Notes (Signed)
 [facility]    CSN: [id number] Arrival date & time: 10/21/23  1633      History   Chief Complaint Chief Complaint  Patient presents with   SEXUALLY TRANSMITTED DISEASE   Letter for School/Work    HPI Teresa Alexander is a 22 y.o. female.   Patient is presenting for STI screening.  She is currently asymptomatic.  She denies any urinary pain, no discharge.  The history is provided by the patient.    History reviewed. No pertinent past medical history.  There are no active problems to display for this patient.   History reviewed. No pertinent surgical history.  OB History   No obstetric history on file.      Home Medications    Prior to Admission medications   Medication Sig Start Date End Date Taking? Authorizing Provider  doxycycline  (VIBRAMYCIN ) 100 MG capsule Take 1 capsule (100 mg total) by mouth 2 (two) times daily. 09/25/23   Hazen Darryle BRAVO, FNP    Family History Family History  Problem Relation Age of Onset   Healthy Mother     Social History Social History   Tobacco Use   Smoking status: Never   Smokeless tobacco: Never  Vaping Use   Vaping status: Every Day  Substance Use Topics   Alcohol use: Yes   Drug use: Yes    Types: Marijuana     Allergies   Patient has no known allergies.   Review of Systems Review of Systems  Respiratory: Negative.    Cardiovascular: Negative.   Gastrointestinal: Negative.   Genitourinary: Negative.   All other systems reviewed and are negative.    Physical Exam Triage Vital Signs ED Triage Vitals  Encounter Vitals Group     BP 10/21/23 1713 105/64     Systolic BP Percentile --      Diastolic BP Percentile --      Pulse Rate 10/21/23 1713 (!) 51     Resp 10/21/23 1713 16     Temp 10/21/23 1713 98.5 F (36.9 C)     Temp Source 10/21/23 1713 Oral     SpO2 10/21/23 1713 95 %     Weight --      Height --      Head Circumference --      Peak Flow --      Pain Score 10/21/23 1712 0      Pain Loc --      Pain Education --      Exclude from Growth Chart --    No data found.  Updated Vital Signs BP 105/64 (BP Location: Right Arm)   Pulse (!) 51   Temp 98.5 F (36.9 C) (Oral)   Resp 16   LMP 10/13/2023 (Exact Date)   SpO2 95%   Visual Acuity Right Eye Distance:   Left Eye Distance:   Bilateral Distance:    Right Eye Near:   Left Eye Near:    Bilateral Near:     Physical Exam Genitourinary:    Pubic Area: No rash.      Vagina: No vaginal discharge.  Neurological:     Mental Status: She is alert and oriented to person, place, and time.      UC Treatments / Results  Labs (all labs ordered are listed, but only abnormal results are displayed) Labs Reviewed  CERVICOVAGINAL ANCILLARY ONLY    EKG   Radiology No results found.  Procedures Procedures (including critical care time)  Medications Ordered  in UC Medications - No data to display  Initial Impression / Assessment and Plan / UC Course  I have reviewed the triage vital signs and the nursing notes.  Pertinent labs & imaging results that were available during my care of the patient were reviewed by me and considered in my medical decision making (see chart for details).   Patient is concern for exposure to STIs.  She does not have any symptoms at this time.  She denies any vaginal discharge, itching, urinary symptoms.  She is requesting swab testing only declines any blood testing. We will call her with any positive results.  Safe sex practices discussed As she has presented to urgent care 5 times over the last 4 months with concerns   Final Clinical Impressions(s) / UC Diagnoses   Final diagnoses:  None   Discharge Instructions   None    ED Prescriptions   None    PDMP not reviewed this encounter.   Sumner Marval HERO, NP 10/21/23 [phone]

## 2023-10-21 NOTE — Discharge Instructions (Signed)
Consider safe sex practices condoms.  We will call you if the screening is positive for any concerns.  Please follow-up with GYN if any concerns of recurrent infections.

## 2023-10-21 NOTE — ED Triage Notes (Signed)
 Pt states she has no Sx.   She want STI swab but no blood work. She also needs a note for work.

## 2023-10-23 LAB — CERVICOVAGINAL ANCILLARY ONLY
Bacterial Vaginitis (gardnerella): NEGATIVE
Candida Glabrata: NEGATIVE
Candida Vaginitis: POSITIVE — AB
Chlamydia: NEGATIVE
Comment: NEGATIVE
Comment: NEGATIVE
Comment: NEGATIVE
Comment: NEGATIVE
Comment: NEGATIVE
Comment: NORMAL
Neisseria Gonorrhea: NEGATIVE
Trichomonas: NEGATIVE

## 2023-10-26 ENCOUNTER — Telehealth: Payer: Self-pay

## 2023-10-26 MED ORDER — FLUCONAZOLE 150 MG PO TABS: 150.0000 mg | ORAL_TABLET | Freq: Once | ORAL | 0 refills | Status: AC

## 2023-10-26 NOTE — Telephone Encounter (Signed)
 Per protocol, pt requires tx with Diflucan.  Rx sent to pharmacy on file.

## 2023-11-19 ENCOUNTER — Ambulatory Visit (HOSPITAL_COMMUNITY): Payer: Medicaid Other

## 2023-11-20 ENCOUNTER — Encounter (HOSPITAL_COMMUNITY): Payer: Self-pay

## 2023-11-20 ENCOUNTER — Ambulatory Visit (HOSPITAL_COMMUNITY)
Admission: RE | Admit: 2023-11-20 | Discharge: 2023-11-20 | Disposition: A | Discharge status: Home / Self Care | Payer: Medicaid Other | Source: Ambulatory Visit | Attending: Emergency Medicine | Admitting: Emergency Medicine

## 2023-11-20 VITALS — BP 107/70 | HR 76 | Temp 98.9°F | Resp 18

## 2023-11-20 DIAGNOSIS — N898 Other specified noninflammatory disorders of vagina: Secondary | ICD-10-CM | POA: Diagnosis present

## 2023-11-20 NOTE — ED Triage Notes (Signed)
Pt c/o vaginal irritation x2wks. Denies unprotected intercourse.

## 2023-11-20 NOTE — ED Provider Notes (Signed)
[facility]    CSN: [id number] Arrival date & time: 11/20/23  1102     History   Chief Complaint Chief Complaint  Patient presents with   Vaginal Itching    HPI Teresa Alexander is a 23 y.o. female.  1-week history of vaginal discharge and slight itching Denies odor Not concerned for STD, no intercourse since previous testing Not having urinary symptoms LMP finished yesterday  Hx yeast and BV, not sure if similar   History reviewed. No pertinent past medical history.  There are no active problems to display for this patient.   History reviewed. No pertinent surgical history.  OB History   No obstetric history on file.      Home Medications    Prior to Admission medications   Medication Sig Start Date End Date Taking? Authorizing Provider  doxycycline (VIBRAMYCIN) 100 MG capsule Take 1 capsule (100 mg total) by mouth 2 (two) times daily. 09/25/23   Gustavus Bryant, FNP    Family History Family History  Problem Relation Age of Onset   Healthy Mother     Social History Social History   Tobacco Use   Smoking status: Never   Smokeless tobacco: Never  Vaping Use   Vaping status: Every Day  Substance Use Topics   Alcohol use: Yes   Drug use: Yes    Types: Marijuana     Allergies   Patient has no known allergies.   Review of Systems Review of Systems Per HPI  Physical Exam Triage Vital Signs ED Triage Vitals  Encounter Vitals Group     BP 11/20/23 1125 107/70     Systolic BP Percentile --      Diastolic BP Percentile --      Pulse Rate 11/20/23 1125 76     Resp 11/20/23 1125 18     Temp 11/20/23 1125 98.9 F (37.2 C)     Temp Source 11/20/23 1125 Oral     SpO2 11/20/23 1125 97 %     Weight --      Height --      Head Circumference --      Peak Flow --      Pain Score 11/20/23 1127 0     Pain Loc --      Pain Education --      Exclude from Growth Chart --    No data found.  Updated Vital Signs BP 107/70 (BP Location:  Right Arm)   Pulse 76   Temp 98.9 F (37.2 C) (Oral)   Resp 18   LMP 11/15/2023 (Approximate)   SpO2 97%    Physical Exam Vitals and nursing note reviewed.  Constitutional:      General: She is not in acute distress.    Appearance: Normal appearance.  Cardiovascular:     Rate and Rhythm: Normal rate and regular rhythm.     Heart sounds: Normal heart sounds.  Pulmonary:     Effort: Pulmonary effort is normal.     Breath sounds: Normal breath sounds.  Abdominal:     General: There is no distension.     Tenderness: There is no abdominal tenderness.  Neurological:     Mental Status: She is alert and oriented to person, place, and time.     UC Treatments / Results  Labs (all labs ordered are listed, but only abnormal results are displayed) Labs Reviewed  CERVICOVAGINAL ANCILLARY ONLY    EKG  Radiology No results found.  Procedures  Procedures   Medications Ordered in UC Medications - No data to display  Initial Impression / Assessment and Plan / UC Course  I have reviewed the triage vital signs and the nursing notes.  Pertinent labs & imaging results that were available during my care of the patient were reviewed by me and considered in my medical decision making (see chart for details).  Cyto swab pending for BV/yeast Treat positive as indicated All questions answered  Final Clinical Impressions(s) / UC Diagnoses   Final diagnoses:  Vaginal discharge     Discharge Instructions      We will call you if yeast or BV returns positive. You can also see these results on MyChart. Results can take 2-4 days.      ED Prescriptions   None    PDMP not reviewed this encounter.   [place]ton Urieta, Ray Church 11/20/23 1218

## 2023-11-20 NOTE — ED Notes (Signed)
Pt had a rx sent to pharmacy on 1/5 to treat yeast infection. Pt states didn't know and hasn't got it.

## 2023-11-20 NOTE — Discharge Instructions (Addendum)
We will call you if yeast or BV returns positive. You can also see these results on MyChart. Results can take 2-4 days.

## 2023-11-21 ENCOUNTER — Telehealth (HOSPITAL_BASED_OUTPATIENT_CLINIC_OR_DEPARTMENT_OTHER): Payer: Self-pay

## 2023-11-21 LAB — CERVICOVAGINAL ANCILLARY ONLY
Bacterial Vaginitis (gardnerella): NEGATIVE
Candida Glabrata: NEGATIVE
Candida Vaginitis: POSITIVE — AB
Comment: NEGATIVE
Comment: NEGATIVE
Comment: NEGATIVE

## 2023-11-21 MED ORDER — FLUCONAZOLE 150 MG PO TABS: 150.0000 mg | ORAL_TABLET | Freq: Once | ORAL | 0 refills | Status: AC

## 2023-11-21 NOTE — Telephone Encounter (Signed)
 Per protocol, pt requires tx with Diflucan.  Rx sent to pharmacy on file.

## 2023-12-30 ENCOUNTER — Ambulatory Visit (HOSPITAL_COMMUNITY)
Admission: RE | Admit: 2023-12-30 | Discharge: 2023-12-30 | Disposition: A | Discharge status: Home / Self Care | Source: Ambulatory Visit | Attending: Emergency Medicine | Admitting: Emergency Medicine

## 2023-12-30 ENCOUNTER — Encounter (HOSPITAL_COMMUNITY): Payer: Self-pay

## 2023-12-30 VITALS — BP 101/69 | HR 71 | Temp 98.1°F | Resp 16 | Ht 65.0 in

## 2023-12-30 DIAGNOSIS — J302 Other seasonal allergic rhinitis: Secondary | ICD-10-CM

## 2023-12-30 DIAGNOSIS — N898 Other specified noninflammatory disorders of vagina: Secondary | ICD-10-CM | POA: Diagnosis not present

## 2023-12-30 DIAGNOSIS — L853 Xerosis cutis: Secondary | ICD-10-CM

## 2023-12-30 DIAGNOSIS — L292 Pruritus vulvae: Secondary | ICD-10-CM | POA: Diagnosis not present

## 2023-12-30 MED ORDER — LORATADINE 10 MG PO TABS: 10.0000 mg | ORAL_TABLET | Freq: Every day | ORAL | 0 refills | Status: DC

## 2023-12-30 MED ORDER — FLUTICASONE PROPIONATE 50 MCG/ACT NA SUSP: 1.0000 | Freq: Every day | NASAL | 0 refills | Status: DC

## 2023-12-30 MED ORDER — FLUCONAZOLE 150 MG PO TABS: ORAL_TABLET | ORAL | 0 refills | Status: DC

## 2023-12-30 NOTE — ED Provider Notes (Signed)
 [facility]    CSN: [id number] Arrival date & time: 12/30/23  1057      History   Chief Complaint Chief Complaint  Patient presents with   Vaginal Itching    HPI Teresa Alexander is a 23 y.o. female.   Patient presents with vaginal itching and discharge x 1 week. Patient describes vaginal discharge as milky white and states that this is consistent with prior yeast infections. Patient reports recent unprotected sexually intercourse and agrees to STD testing.   Denies abdominal pain, flank pain, fever, dysuria, vaginal pain, and vaginal bleeding.    Patient also requesting refill of seasonal allergy medication. Patient reports she normally takes Claritin daily and uses Flonase nasal spray.   Additionally, patient as concerned regarding dry skin to her face. Patient states that she has been applying lotion and Vaseline with some relief of dryness.   Vaginal Itching    History reviewed. No pertinent past medical history.  There are no active problems to display for this patient.   History reviewed. No pertinent surgical history.  OB History   No obstetric history on file.      Home Medications    Prior to Admission medications   Medication Sig Start Date End Date Taking? Authorizing Provider  fluconazole (DIFLUCAN) 150 MG tablet Take one tablet today and one tablet in 3 days if symptoms persist. 12/30/23  Yes Wynonia Lawman A, NP  fluticasone (FLONASE) 50 MCG/ACT nasal spray Place 1 spray into both nostrils daily. 12/30/23  Yes Susann Givens, Journei Thomassen A, NP  loratadine (CLARITIN) 10 MG tablet Take 1 tablet (10 mg total) by mouth daily. 12/30/23  Yes Letta Kocher, NP    Family History Family History  Problem Relation Age of Onset   Healthy Mother     Social History Social History   Tobacco Use   Smoking status: Never   Smokeless tobacco: Never  Vaping Use   Vaping status: Every Day  Substance Use Topics   Alcohol use: Yes   Drug use: Yes     Types: Marijuana     Allergies   Patient has no known allergies.   Review of Systems Review of Systems  Per HPI  Physical Exam Triage Vital Signs ED Triage Vitals  Encounter Vitals Group     BP 12/30/23 1118 101/69     Systolic BP Percentile --      Diastolic BP Percentile --      Pulse Rate 12/30/23 1118 71     Resp 12/30/23 1118 16     Temp 12/30/23 1118 98.1 F (36.7 C)     Temp Source 12/30/23 1118 Oral     SpO2 12/30/23 1118 97 %     Weight --      Height 12/30/23 1119 5\' 5"  (1.651 m)     Head Circumference --      Peak Flow --      Pain Score 12/30/23 1119 0     Pain Loc --      Pain Education --      Exclude from Growth Chart --    No data found.  Updated Vital Signs BP 101/69 (BP Location: Right Arm)   Pulse 71   Temp 98.1 F (36.7 C) (Oral)   Resp 16   Ht 5\' 5"  (1.651 m)   LMP 12/15/2023 (Approximate)   SpO2 97%   BMI 27.29 kg/m   Visual Acuity Right Eye Distance:   Left Eye Distance:   Bilateral  Distance:    Right Eye Near:   Left Eye Near:    Bilateral Near:     Physical Exam Vitals and nursing note reviewed.  Constitutional:      General: She is awake. She is not in acute distress.    Appearance: Normal appearance. She is well-developed and well-groomed. She is not ill-appearing.  HENT:     Nose: Nose normal.     Mouth/Throat:     Mouth: Mucous membranes are moist.     Pharynx: Oropharynx is clear.  Abdominal:     General: Abdomen is flat. Bowel sounds are normal.     Palpations: Abdomen is soft.     Tenderness: There is no abdominal tenderness. There is no right CVA tenderness or left CVA tenderness.  Genitourinary:    Comments: Exam deferred. Skin:    General: Skin is warm and dry.     Comments: Dryness noted to face without rash or erythema.   Neurological:     Mental Status: She is alert.  Psychiatric:        Behavior: Behavior is cooperative.      UC Treatments / Results  Labs (all labs ordered are listed, but  only abnormal results are displayed) Labs Reviewed  CERVICOVAGINAL ANCILLARY ONLY    EKG   Radiology No results found.  Procedures Procedures (including critical care time)  Medications Ordered in UC Medications - No data to display  Initial Impression / Assessment and Plan / UC Course  I have reviewed the triage vital signs and the nursing notes.  Pertinent labs & imaging results that were available during my care of the patient were reviewed by me and considered in my medical decision making (see chart for details).     No significant findings on exam other than mild dryness noted to face. GU exam deferred, patient performed self swab for STD/STI. Declines HIV and syphilis testing.   Prescribed Diflucan for possible yeast coverage. Prescribed Claritin and Flonase per patient request. Recommended continuing with Vaseline or Aquaphor and following up with dermatology if symptoms persist.  Discussed follow-up and return precautions.  Final Clinical Impressions(s) / UC Diagnoses   Final diagnoses:  Seasonal allergic rhinitis, unspecified trigger  Vaginal discharge  Vaginal itching  Dry skin     Discharge Instructions      I have prescribed Diflucan for possible yeast infection. Take one table today and another tablet in 3 days if symptoms persist. Your swab results will come back over the next few days and someone will call if results are positive and require treatment.   I have sent prescriptions of Claritin (daily allergy medication) and flonase nasal spray as well. Use these once daily to help with allergies.  Regarding the dry skin on your face, I recommend using Aquaphor or Vaseline to help with this. If symptoms persist you can always follow-up with dermatology that you were seeing previously.   Return here as needed.     ED Prescriptions     Medication Sig Dispense Auth. Provider   loratadine (CLARITIN) 10 MG tablet Take 1 tablet (10 mg total) by mouth  daily. 90 tablet Susann Givens, Kaydee Magel A, NP   fluticasone (FLONASE) 50 MCG/ACT nasal spray Place 1 spray into both nostrils daily. 11.1 mL Wynonia Lawman A, NP   fluconazole (DIFLUCAN) 150 MG tablet Take one tablet today and one tablet in 3 days if symptoms persist. 2 tablet Wynonia Lawman A, NP      PDMP not reviewed this  encounter.   Wynonia Lawman A, NP 12/30/23 1528

## 2023-12-30 NOTE — ED Triage Notes (Signed)
 Patient here today with c/o vaginal itching and wetness X 1 week.   Patient is also requesting a medication for allergies.

## 2023-12-30 NOTE — Discharge Instructions (Signed)
 I have prescribed Diflucan for possible yeast infection. Take one table today and another tablet in 3 days if symptoms persist. Your swab results will come back over the next few days and someone will call if results are positive and require treatment.   I have sent prescriptions of Claritin (daily allergy medication) and flonase nasal spray as well. Use these once daily to help with allergies.  Regarding the dry skin on your face, I recommend using Aquaphor or Vaseline to help with this. If symptoms persist you can always follow-up with dermatology that you were seeing previously.   Return here as needed.

## 2023-12-31 LAB — CERVICOVAGINAL ANCILLARY ONLY
Bacterial Vaginitis (gardnerella): NEGATIVE
Candida Glabrata: NEGATIVE
Candida Vaginitis: POSITIVE — AB
Chlamydia: NEGATIVE
Comment: NEGATIVE
Comment: NEGATIVE
Comment: NEGATIVE
Comment: NEGATIVE
Comment: NEGATIVE
Comment: NORMAL
Neisseria Gonorrhea: NEGATIVE
Trichomonas: NEGATIVE

## 2024-03-08 ENCOUNTER — Ambulatory Visit (HOSPITAL_COMMUNITY)
Admission: RE | Admit: 2024-03-08 | Discharge: 2024-03-08 | Disposition: A | Discharge status: Home / Self Care | Source: Ambulatory Visit | Attending: Emergency Medicine | Admitting: Emergency Medicine

## 2024-03-08 ENCOUNTER — Encounter (HOSPITAL_COMMUNITY): Payer: Self-pay

## 2024-03-08 VITALS — BP 110/69 | HR 62 | Temp 98.0°F | Resp 16

## 2024-03-08 DIAGNOSIS — Z3202 Encounter for pregnancy test, result negative: Secondary | ICD-10-CM | POA: Diagnosis not present

## 2024-03-08 DIAGNOSIS — Z113 Encounter for screening for infections with a predominantly sexual mode of transmission: Secondary | ICD-10-CM | POA: Diagnosis present

## 2024-03-08 DIAGNOSIS — N898 Other specified noninflammatory disorders of vagina: Secondary | ICD-10-CM | POA: Diagnosis present

## 2024-03-08 LAB — POCT URINE PREGNANCY: Preg Test, Ur: NEGATIVE

## 2024-03-08 LAB — POCT URINALYSIS DIP (MANUAL ENTRY)
Glucose, UA: NEGATIVE mg/dL
Leukocytes, UA: NEGATIVE
Nitrite, UA: NEGATIVE
Protein Ur, POC: 30 mg/dL — AB
Spec Grav, UA: >=1.03 — AB (ref 1.010–1.025)
Urobilinogen, UA: 1 U/dL
pH, UA: 6 (ref 5.0–8.0)

## 2024-03-08 MED ORDER — METRONIDAZOLE 500 MG PO TABS: 500.0000 mg | ORAL_TABLET | Freq: Two times a day (BID) | ORAL | 0 refills | Status: DC

## 2024-03-08 NOTE — Discharge Instructions (Addendum)
 Take the Flagyl  twice daily for the next 7 days.  Take it with food to help prevent stomach upset.  Do not drink alcohol on this medication.  Our staff will contact you if additional treatment or treatment changes are needed.  Abstain from intercourse until results have been received.

## 2024-03-08 NOTE — ED Triage Notes (Signed)
 C/O vaginal discharge onset approx 3-5 days ago without abd pain or fevers. No known exposures.

## 2024-03-08 NOTE — ED Provider Notes (Signed)
 [facility]    CSN: [id number] Arrival date & time: 03/08/24  0945      History   Chief Complaint Chief Complaint  Patient presents with   Vaginal Discharge   Appt 0930    HPI Teresa Alexander is a 23 y.o. female.   Patient presents to clinic over concerns of increased vaginal discharge with an odor for the past 3 to 5 days.  This happened shortly after unprotected intercourse with a consistent partner.  Her and this partner will go back and forth with using condoms.  Does have a history of BV and yeast.  Has had mild vaginal itching.  Vaginal discharge is white and thick.  Denies urinary symptoms.  Denies vaginal sores.  Denies flank pain, abdominal pain or pelvic pain.  Had HIV and syphilis screening in December 2024, reports she is on a time crunch and needs to return to work, but her next appointment she will be sure to get HIV and syphilis screening.  The history is provided by the patient and medical records.  Vaginal Discharge   History reviewed. No pertinent past medical history.  There are no active problems to display for this patient.   History reviewed. No pertinent surgical history.  OB History   No obstetric history on file.      Home Medications    Prior to Admission medications   Medication Sig Start Date End Date Taking? Authorizing Provider  metroNIDAZOLE  (FLAGYL ) 500 MG tablet Take 1 tablet (500 mg total) by mouth 2 (two) times daily. 03/08/24  Yes Omunique Pederson  N, FNP  fluconazole  (DIFLUCAN ) 150 MG tablet Take one tablet today and one tablet in 3 days if symptoms persist. 12/30/23   Levora Reas A, NP  fluticasone  (FLONASE ) 50 MCG/ACT nasal spray Place 1 spray into both nostrils daily. 12/30/23   Levora Reas A, NP  loratadine  (CLARITIN ) 10 MG tablet Take 1 tablet (10 mg total) by mouth daily. 12/30/23   Karon Packer, NP    Family History Family History  Problem Relation Age of Onset   Healthy Mother     Social  History Social History   Tobacco Use   Smoking status: Never   Smokeless tobacco: Never  Vaping Use   Vaping status: Every Day  Substance Use Topics   Alcohol use: Yes    Comment: occasionally   Drug use: Yes    Types: Marijuana     Allergies   Patient has no known allergies.   Review of Systems Review of Systems  Per HPI  Physical Exam Triage Vital Signs ED Triage Vitals  Encounter Vitals Group     BP 03/08/24 1016 110/69     Systolic BP Percentile --      Diastolic BP Percentile --      Pulse Rate 03/08/24 1016 62     Resp 03/08/24 1016 16     Temp 03/08/24 1016 98 F (36.7 C)     Temp Source 03/08/24 1016 Oral     SpO2 03/08/24 1016 97 %     Weight --      Height --      Head Circumference --      Peak Flow --      Pain Score 03/08/24 1017 0     Pain Loc --      Pain Education --      Exclude from Growth Chart --    No data found.  Updated Vital Signs BP 110/69  Pulse 62   Temp 98 F (36.7 C) (Oral)   Resp 16   LMP 02/15/2024 (Approximate)   SpO2 97%   Visual Acuity Right Eye Distance:   Left Eye Distance:   Bilateral Distance:    Right Eye Near:   Left Eye Near:    Bilateral Near:     Physical Exam Vitals and nursing note reviewed.  Constitutional:      Appearance: Normal appearance.  HENT:     Head: Normocephalic and atraumatic.     Right Ear: External ear normal.     Left Ear: External ear normal.     Nose: Nose normal.     Mouth/Throat:     Mouth: Mucous membranes are moist.  Eyes:     Conjunctiva/sclera: Conjunctivae normal.  Cardiovascular:     Rate and Rhythm: Normal rate.  Pulmonary:     Effort: Pulmonary effort is normal. No respiratory distress.  Skin:    General: Skin is warm and dry.  Neurological:     General: No focal deficit present.     Mental Status: She is alert.  Psychiatric:        Mood and Affect: Mood normal.      UC Treatments / Results  Labs (all labs ordered are listed, but only abnormal  results are displayed) Labs Reviewed  POCT URINALYSIS DIP (MANUAL ENTRY) - Abnormal; Notable for the following components:      Result Value   Color, UA straw (*)    Clarity, UA hazy (*)    Bilirubin, UA small (*)    Ketones, POC UA moderate (40) (*)    Spec Grav, UA >=1.030 (*)    Blood, UA trace-intact (*)    Protein Ur, POC =30 (*)    All other components within normal limits  POCT URINE PREGNANCY  CERVICOVAGINAL ANCILLARY ONLY    EKG   Radiology No results found.  Procedures Procedures (including critical care time)  Medications Ordered in UC Medications - No data to display  Initial Impression / Assessment and Plan / UC Course  I have reviewed the triage vital signs and the nursing notes.  Pertinent labs & imaging results that were available during my care of the patient were reviewed by me and considered in my medical decision making (see chart for details).  Vitals in triage reviewed, patient is hemodynamically stable.  Increased vaginal discharge that is white with an odor, concerning for bacterial vaginosis.  Recent unprotected sexual intercourse, cytology swab obtained.  Urine pregnancy negative.  UA does not appear to have UTI at this time, without urinary symptoms.  Does appear to be dehydrated.  Will start on Flagyl  and staff will contact if treatment modification is needed.  Plan of care, follow-up care return precautions given, no questions at this time.     Final Clinical Impressions(s) / UC Diagnoses   Final diagnoses:  Vaginal discharge  Screening examination for sexually transmitted disease     Discharge Instructions      Take the Flagyl  twice daily for the next 7 days.  Take it with food to help prevent stomach upset.  Do not drink alcohol on this medication.  Our staff will contact you if additional treatment or treatment changes are needed.  Abstain from intercourse until results have been received.   ED Prescriptions     Medication Sig  Dispense Auth. Provider   metroNIDAZOLE  (FLAGYL ) 500 MG tablet Take 1 tablet (500 mg total) by mouth 2 (two) times daily.  14 tablet Harlow Lighter, Kennady Zimmerle  N, FNP      PDMP not reviewed this encounter.   Rosezena Contes, FNP 03/08/24 1039

## 2024-03-09 ENCOUNTER — Ambulatory Visit (HOSPITAL_COMMUNITY): Payer: Self-pay

## 2024-03-09 LAB — CERVICOVAGINAL ANCILLARY ONLY
Bacterial Vaginitis (gardnerella): POSITIVE — AB
Candida Glabrata: NEGATIVE
Candida Vaginitis: POSITIVE — AB
Chlamydia: NEGATIVE
Comment: NEGATIVE
Comment: NEGATIVE
Comment: NEGATIVE
Comment: NEGATIVE
Comment: NEGATIVE
Comment: NORMAL
Neisseria Gonorrhea: NEGATIVE
Trichomonas: NEGATIVE

## 2024-03-09 MED ORDER — FLUCONAZOLE 150 MG PO TABS: 150.0000 mg | ORAL_TABLET | Freq: Once | ORAL | 0 refills | Status: AC

## 2024-03-11 ENCOUNTER — Telehealth (HOSPITAL_COMMUNITY): Payer: Self-pay

## 2024-03-11 NOTE — Telephone Encounter (Signed)
 Patient called and left a message with Teresa Alexander up front and stated that she had questions about her medications. I attempted to call the patient back multiple times but she did not answer.

## 2024-03-19 ENCOUNTER — Ambulatory Visit (HOSPITAL_COMMUNITY)
Admission: RE | Admit: 2024-03-19 | Discharge: 2024-03-19 | Disposition: A | Discharge status: Home / Self Care | Source: Ambulatory Visit

## 2024-03-19 ENCOUNTER — Encounter (HOSPITAL_COMMUNITY): Payer: Self-pay

## 2024-03-19 VITALS — BP 113/68 | HR 77 | Temp 98.4°F | Resp 18

## 2024-03-19 DIAGNOSIS — B3731 Acute candidiasis of vulva and vagina: Secondary | ICD-10-CM | POA: Insufficient documentation

## 2024-03-19 DIAGNOSIS — B9689 Other specified bacterial agents as the cause of diseases classified elsewhere: Secondary | ICD-10-CM | POA: Diagnosis not present

## 2024-03-19 DIAGNOSIS — Z3202 Encounter for pregnancy test, result negative: Secondary | ICD-10-CM

## 2024-03-19 DIAGNOSIS — N760 Acute vaginitis: Secondary | ICD-10-CM | POA: Diagnosis not present

## 2024-03-19 DIAGNOSIS — B354 Tinea corporis: Secondary | ICD-10-CM | POA: Diagnosis not present

## 2024-03-19 DIAGNOSIS — Z113 Encounter for screening for infections with a predominantly sexual mode of transmission: Secondary | ICD-10-CM

## 2024-03-19 DIAGNOSIS — R21 Rash and other nonspecific skin eruption: Secondary | ICD-10-CM | POA: Diagnosis not present

## 2024-03-19 LAB — POCT URINALYSIS DIP (MANUAL ENTRY)
Bilirubin, UA: NEGATIVE
Blood, UA: NEGATIVE
Glucose, UA: NEGATIVE mg/dL
Ketones, POC UA: NEGATIVE mg/dL
Leukocytes, UA: NEGATIVE
Nitrite, UA: NEGATIVE
Protein Ur, POC: NEGATIVE mg/dL
Spec Grav, UA: 1.025 (ref 1.010–1.025)
Urobilinogen, UA: 0.2 U/dL
pH, UA: 7 (ref 5.0–8.0)

## 2024-03-19 LAB — POCT URINE PREGNANCY: Preg Test, Ur: NEGATIVE

## 2024-03-19 MED ORDER — TRIAMCINOLONE ACETONIDE 0.1 % EX CREA: 1.0000 | TOPICAL_CREAM | Freq: Two times a day (BID) | CUTANEOUS | 0 refills | Status: AC

## 2024-03-19 MED ORDER — CLOTRIMAZOLE 1 % EX CREA: TOPICAL_CREAM | CUTANEOUS | 0 refills | Status: AC

## 2024-03-19 NOTE — ED Provider Notes (Signed)
 [facility]    CSN: [id number] Arrival date & time: 03/19/24  1517      History   Chief Complaint Chief Complaint  Patient presents with   Rash   SEXUALLY TRANSMITTED DISEASE    HPI Teresa Alexander is a 23 y.o. female.   Patient presents to clinic requesting screening for sexually transmitted infections.  She is late on her menstrual cycle, did take a pregnancy test at home and it was negative yesterday.  Has not had any dysuria.  Did have vaginal odor and increased vaginal discharge a few weeks ago, this is improving.  She is currently under treatment for bacterial vaginosis and yeast vaginitis.  Has not been sexually active since her last visit.  Has an itchy rash to her right chest area.  Has been present for the past few months.  Has gotten bigger and more defined over the past week.  It is circular and over her right chest.  Also has an itchy rash to her left inner knee.  This has been present for the past day or so.  Has not tried any medications or interventions for this rash.  The history is provided by the patient and medical records.  Rash   History reviewed. No pertinent past medical history.  There are no active problems to display for this patient.   History reviewed. No pertinent surgical history.  OB History   No obstetric history on file.      Home Medications    Prior to Admission medications   Medication Sig Start Date End Date Taking? Authorizing Provider  clotrimazole (LOTRIMIN) 1 % cream Apply to chest area 2 times daily 03/19/24  Yes Karilynn Carranza  N, FNP  metroNIDAZOLE  (FLAGYL ) 500 MG tablet Take 500 mg by mouth 3 (three) times daily.   Yes [provider]  triamcinolone cream (KENALOG) 0.1 % Apply 1 Application topically 2 (two) times daily. Apply to knee area 2x daily for 7 days 03/19/24  Yes Harlow Lighter, Ordell Prichett  N, FNP  fluconazole  (DIFLUCAN ) 150 MG tablet Take one tablet today and one tablet in 3 days if symptoms  persist. 12/30/23   Karon Packer, NP    Family History Family History  Problem Relation Age of Onset   Healthy Mother     Social History Social History   Tobacco Use   Smoking status: Never   Smokeless tobacco: Never  Vaping Use   Vaping status: Every Day  Substance Use Topics   Alcohol use: Yes    Comment: occasionally   Drug use: Yes    Types: Marijuana     Allergies   Patient has no known allergies.   Review of Systems Review of Systems  Per HPI  Physical Exam Triage Vital Signs ED Triage Vitals  Encounter Vitals Group     BP 03/19/24 1537 113/68     Systolic BP Percentile --      Diastolic BP Percentile --      Pulse Rate 03/19/24 1537 77     Resp 03/19/24 1537 18     Temp 03/19/24 1537 98.4 F (36.9 C)     Temp Source 03/19/24 1537 Oral     SpO2 03/19/24 1537 100 %     Weight --      Height --      Head Circumference --      Peak Flow --      Pain Score 03/19/24 1538 0     Pain Loc --  Pain Education --      Exclude from Growth Chart --    No data found.  Updated Vital Signs BP 113/68 (BP Location: Right Arm)   Pulse 77   Temp 98.4 F (36.9 C) (Oral)   Resp 18   LMP 02/14/2024 (Approximate)   SpO2 100%   Visual Acuity Right Eye Distance:   Left Eye Distance:   Bilateral Distance:    Right Eye Near:   Left Eye Near:    Bilateral Near:     Physical Exam Vitals and nursing note reviewed.  Constitutional:      Appearance: Normal appearance.  HENT:     Head: Normocephalic and atraumatic.     Right Ear: External ear normal.     Left Ear: External ear normal.     Nose: Nose normal.     Mouth/Throat:     Mouth: Mucous membranes are moist.  Eyes:     Conjunctiva/sclera: Conjunctivae normal.  Cardiovascular:     Rate and Rhythm: Normal rate.  Pulmonary:     Effort: Pulmonary effort is normal. No respiratory distress.  Skin:    General: Skin is warm and dry.     Findings: Rash present.       Neurological:      General: No focal deficit present.     Mental Status: She is alert.  Psychiatric:        Mood and Affect: Mood normal.      UC Treatments / Results  Labs (all labs ordered are listed, but only abnormal results are displayed) Labs Reviewed  POCT URINALYSIS DIP (MANUAL ENTRY)  POCT URINE PREGNANCY  CERVICOVAGINAL ANCILLARY ONLY    EKG   Radiology No results found.  Procedures Procedures (including critical care time)  Medications Ordered in UC Medications - No data to display  Initial Impression / Assessment and Plan / UC Course  I have reviewed the triage vital signs and the nursing notes.  Pertinent labs & imaging results that were available during my care of the patient were reviewed by me and considered in my medical decision making (see chart for details).  Vitals in triage reviewed, patient is hemodynamically stable.  Continuing with treatment for BV and yeast, advise retesting in the next few weeks if still symptomatic.  Patient would like STI testing, will complete with cytology swab today.  Right chest annular rash concerning for tinea corporis, will treat with topical clotrimazole.  Appears to have a contact dermatitis of the left inner knee, will treat with a steroid ointment for itching.  Symptomatic management reviewed.  Plan of care, follow-up care return precautions given, no questions at this time.     Final Clinical Impressions(s) / UC Diagnoses   Final diagnoses:  Tinea corporis  Rash and nonspecific skin eruption  Screening examination for sexually transmitted disease  Urine pregnancy test negative     Discharge Instructions      Use the clotrimazole cream to the circular area to your chest twice daily until the rash resolves, this can be up to 3 weeks.  Use the triamcinolone cream to the left knee rash.  You can take 25 mg of Benadryl every 6 hours to help with itching from either rash.  Continue taking antibiotics for your bacterial  vaginosis and take your Diflucan  for yeast vaginitis.  If you continue to have vaginal discharge, odor or changes in 2 weeks, you can return to clinic for retesting.    ED Prescriptions  Medication Sig Dispense Auth. Provider   clotrimazole (LOTRIMIN) 1 % cream Apply to chest area 2 times daily 28 g Harlow Lighter, Madisyn Mawhinney  N, FNP   triamcinolone cream (KENALOG) 0.1 % Apply 1 Application topically 2 (two) times daily. Apply to knee area 2x daily for 7 days 30 g Cyndi Drain, FNP      PDMP not reviewed this encounter.   Harlow Lighter, Mercer Peifer  N, FNP 03/19/24 1623

## 2024-03-19 NOTE — ED Triage Notes (Signed)
 Pt c/o a discoloration spot on her chest that's always there but is bigger and red x1wk.  Pt requesting STD testing. Denies sx's. States [facility] 4/26, had neg home pregnancy test yesterday.

## 2024-03-19 NOTE — Discharge Instructions (Addendum)
 Use the clotrimazole cream to the circular area to your chest twice daily until the rash resolves, this can be up to 3 weeks.  Use the triamcinolone cream to the left knee rash.  You can take 25 mg of Benadryl every 6 hours to help with itching from either rash.  Continue taking antibiotics for your bacterial vaginosis and take your Diflucan  for yeast vaginitis.  If you continue to have vaginal discharge, odor or changes in 2 weeks, you can return to clinic for retesting.

## 2024-03-22 LAB — CERVICOVAGINAL ANCILLARY ONLY
Chlamydia: NEGATIVE
Comment: NEGATIVE
Comment: NEGATIVE
Comment: NORMAL
Neisseria Gonorrhea: NEGATIVE
Trichomonas: NEGATIVE

## 2024-04-06 ENCOUNTER — Ambulatory Visit (HOSPITAL_COMMUNITY)
Admission: RE | Admit: 2024-04-06 | Discharge: 2024-04-06 | Disposition: A | Discharge status: Home / Self Care | Source: Ambulatory Visit | Attending: Emergency Medicine | Admitting: Emergency Medicine

## 2024-04-06 ENCOUNTER — Encounter (HOSPITAL_COMMUNITY): Payer: Self-pay

## 2024-04-06 ENCOUNTER — Ambulatory Visit (INDEPENDENT_AMBULATORY_CARE_PROVIDER_SITE_OTHER)

## 2024-04-06 VITALS — BP 96/60 | HR 68 | Temp 98.0°F | Resp 16

## 2024-04-06 DIAGNOSIS — N898 Other specified noninflammatory disorders of vagina: Secondary | ICD-10-CM | POA: Insufficient documentation

## 2024-04-06 DIAGNOSIS — R519 Headache, unspecified: Secondary | ICD-10-CM | POA: Diagnosis not present

## 2024-04-06 DIAGNOSIS — Z113 Encounter for screening for infections with a predominantly sexual mode of transmission: Secondary | ICD-10-CM | POA: Diagnosis not present

## 2024-04-06 MED ORDER — IBUPROFEN 400 MG PO TABS: 400.0000 mg | ORAL_TABLET | Freq: Four times a day (QID) | ORAL | 0 refills | Status: DC | PRN
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
## 2024-04-06 NOTE — Discharge Instructions (Signed)
 Your x-ray was negative for any fracture your face. I recommend alternate between 650 mg of Tylenol  and 400 mg ibuprofen every 6-8 hours as needed for pain. You can also apply ice to help with the swelling and bruising. Your swab results will return over the next few days and someone will call if results are positive and require any treatment. Follow-up with your primary care provider or return here as needed.

## 2024-04-07 LAB — CERVICOVAGINAL ANCILLARY ONLY
Bacterial Vaginitis (gardnerella): NEGATIVE
Candida Glabrata: NEGATIVE
Candida Vaginitis: NEGATIVE
Chlamydia: NEGATIVE
Comment: NEGATIVE
Comment: NEGATIVE
Comment: NEGATIVE
Comment: NEGATIVE
Comment: NEGATIVE
Comment: NORMAL
Neisseria Gonorrhea: NEGATIVE
Trichomonas: NEGATIVE

## 2024-05-01 ENCOUNTER — Ambulatory Visit (HOSPITAL_COMMUNITY)

## 2024-05-02 ENCOUNTER — Ambulatory Visit (HOSPITAL_COMMUNITY)
Admission: RE | Admit: 2024-05-02 | Discharge: 2024-05-02 | Disposition: A | Discharge status: Home / Self Care | Source: Ambulatory Visit | Attending: Emergency Medicine | Admitting: Emergency Medicine

## 2024-05-02 ENCOUNTER — Encounter (HOSPITAL_COMMUNITY): Payer: Self-pay

## 2024-05-02 VITALS — BP 103/66 | HR 78 | Temp 98.4°F | Resp 14

## 2024-05-02 DIAGNOSIS — L02215 Cutaneous abscess of perineum: Secondary | ICD-10-CM | POA: Diagnosis not present

## 2024-05-02 MED ORDER — SULFAMETHOXAZOLE-TRIMETHOPRIM 800-160 MG PO TABS: 1.0000 | ORAL_TABLET | Freq: Two times a day (BID) | ORAL | 0 refills | Status: AC

## 2024-05-02 NOTE — ED Provider Notes (Signed)
 [facility]    CSN: [id number] Arrival date & time: 05/02/24  1646      History   Chief Complaint Chief Complaint  Patient presents with   Abscess    HPI Teresa Alexander is a 23 y.o. female.   Patient presents with concerns for abscess between her vagina and rectum that she noticed 3 days ago.  Patient states that has increased in size and pain over the last few days.  Patient does report a history of abscess to her buttock in December.  Denies fever, body aches, chills.  Denies drainage from the area.  The history is provided by the patient and medical records.  Abscess   History reviewed. No pertinent past medical history.  There are no active problems to display for this patient.   History reviewed. No pertinent surgical history.  OB History   No obstetric history on file.      Home Medications    Prior to Admission medications   Medication Sig Start Date End Date Taking? Authorizing Provider  sulfamethoxazole -trimethoprim  (BACTRIM  DS) 800-160 MG tablet Take 1 tablet by mouth 2 (two) times daily for 7 days. 05/02/24 05/09/24 Yes Johnie Flaming A, NP  clotrimazole  (LOTRIMIN ) 1 % cream Apply to chest area 2 times daily 03/19/24   Dreama, [place]  N, FNP  triamcinolone  cream (KENALOG ) 0.1 % Apply 1 Application topically 2 (two) times daily. Apply to knee area 2x daily for 7 days 03/19/24   Dreama, [place]  N, FNP    Family History Family History  Problem Relation Age of Onset   Healthy Mother     Social History Social History   Tobacco Use   Smoking status: Never   Smokeless tobacco: Never  Vaping Use   Vaping status: Every Day   Substances: Nicotine, Flavoring  Substance Use Topics   Alcohol use: Yes    Comment: occasionally   Drug use: Yes    Types: Marijuana     Allergies   Patient has no known allergies.   Review of Systems Review of Systems  Per HPI  Physical Exam Triage Vital Signs ED Triage Vitals [05/02/24 1659]   Encounter Vitals Group     BP 103/66     Girls Systolic BP Percentile      Girls Diastolic BP Percentile      Boys Systolic BP Percentile      Boys Diastolic BP Percentile      Pulse Rate 78     Resp 14     Temp 98.4 F (36.9 C)     Temp Source Oral     SpO2      Weight      Height      Head Circumference      Peak Flow      Pain Score 9     Pain Loc      Pain Education      Exclude from Growth Chart    No data found.  Updated Vital Signs BP 103/66 (BP Location: Right Arm)   Pulse 78   Temp 98.4 F (36.9 C) (Oral)   Resp 14   LMP 04/26/2024 (Exact Date)   Visual Acuity Right Eye Distance:   Left Eye Distance:   Bilateral Distance:    Right Eye Near:   Left Eye Near:    Bilateral Near:     Physical Exam Vitals and nursing note reviewed. Exam conducted with a chaperone present.  Constitutional:      General:  She is awake. She is not in acute distress.    Appearance: Normal appearance. She is well-developed and well-groomed. She is not ill-appearing.  Genitourinary:     Comments: Approximately 3 cm x 2 cm area of induration noted between the vagina and rectum. Skin:    General: Skin is warm and dry.     Findings: Abscess present.  Neurological:     Mental Status: She is alert.  Psychiatric:        Behavior: Behavior is cooperative.      UC Treatments / Results  Labs (all labs ordered are listed, but only abnormal results are displayed) Labs Reviewed - No data to display  EKG   Radiology No results found.  Procedures Procedures (including critical care time)  Medications Ordered in UC Medications - No data to display  Initial Impression / Assessment and Plan / UC Course  I have reviewed the triage vital signs and the nursing notes.  Pertinent labs & imaging results that were available during my care of the patient were reviewed by me and considered in my medical decision making (see chart for details).     Patient is overall  well-appearing.  Vitals are stable.  Exam is consistent with presence of abscess.  Deferred I&D due to lack of fluctuance at this time.  Prescribed Bactrim  for abscess coverage.  Discussed follow-up and return precautions. Final Clinical Impressions(s) / UC Diagnoses   Final diagnoses:  Perineal abscess     Discharge Instructions      Start taking Bactrim  twice daily for 7 days for abscess coverage. Apply warm compresses and do warm soaks in the tub to help decrease swelling and promote drainage. Alternate between 650 mg of Tylenol  and 400 mg of ibuprofen  every 6-8 hours as needed for pain. Return here if you notice increased swelling, pain, or lots of drainage from the area.   ED Prescriptions     Medication Sig Dispense Auth. Provider   sulfamethoxazole -trimethoprim  (BACTRIM  DS) 800-160 MG tablet Take 1 tablet by mouth 2 (two) times daily for 7 days. 14 tablet Johnie Flaming A, NP      PDMP not reviewed this encounter.   Johnie Flaming A, NP 05/02/24 [phone]

## 2024-05-02 NOTE — Discharge Instructions (Signed)
 Start taking Bactrim  twice daily for 7 days for abscess coverage. Apply warm compresses and do warm soaks in the tub to help decrease swelling and promote drainage. Alternate between 650 mg of Tylenol  and 400 mg of ibuprofen  every 6-8 hours as needed for pain. Return here if you notice increased swelling, pain, or lots of drainage from the area.

## 2024-05-02 NOTE — ED Triage Notes (Signed)
 Patient reports that she has an abscess between her vagina and rectum x 3 days.

## 2024-06-18 ENCOUNTER — Ambulatory Visit (HOSPITAL_COMMUNITY)

## 2024-06-22 ENCOUNTER — Ambulatory Visit (HOSPITAL_COMMUNITY)
Admission: EM | Admit: 2024-06-22 | Discharge: 2024-06-22 | Disposition: A | Discharge status: Home / Self Care | Attending: Emergency Medicine | Admitting: Emergency Medicine

## 2024-06-22 ENCOUNTER — Encounter (HOSPITAL_COMMUNITY): Payer: Self-pay | Admitting: *Deleted

## 2024-06-22 DIAGNOSIS — N898 Other specified noninflammatory disorders of vagina: Secondary | ICD-10-CM | POA: Insufficient documentation

## 2024-06-22 DIAGNOSIS — B9789 Other viral agents as the cause of diseases classified elsewhere: Secondary | ICD-10-CM | POA: Insufficient documentation

## 2024-06-22 DIAGNOSIS — R051 Acute cough: Secondary | ICD-10-CM | POA: Diagnosis present

## 2024-06-22 DIAGNOSIS — Z3202 Encounter for pregnancy test, result negative: Secondary | ICD-10-CM | POA: Diagnosis not present

## 2024-06-22 DIAGNOSIS — J988 Other specified respiratory disorders: Secondary | ICD-10-CM | POA: Insufficient documentation

## 2024-06-22 LAB — POCT URINALYSIS DIP (MANUAL ENTRY)
Bilirubin, UA: NEGATIVE
Blood, UA: NEGATIVE
Glucose, UA: NEGATIVE mg/dL
Ketones, POC UA: NEGATIVE mg/dL
Leukocytes, UA: NEGATIVE
Nitrite, UA: NEGATIVE
Protein Ur, POC: NEGATIVE mg/dL
Spec Grav, UA: 1.025 (ref 1.010–1.025)
Urobilinogen, UA: 0.2 U/dL
pH, UA: 5.5 (ref 5.0–8.0)

## 2024-06-22 LAB — POC COVID19/FLU A&B COMBO
Covid Antigen, POC: NEGATIVE
Influenza A Antigen, POC: NEGATIVE
Influenza B Antigen, POC: NEGATIVE

## 2024-06-22 LAB — POCT URINE PREGNANCY: Preg Test, Ur: NEGATIVE

## 2024-06-22 NOTE — Discharge Instructions (Signed)
 COVID and flu testing were negative today.  I believe your symptoms are likely related to a viral respiratory illness. You can take over-the-counter Mucinex as needed for cough congestion. Alternate between 650 mg of Tylenol  and 400 mg of ibuprofen  every 6-8 hours as needed for pain or any fever. Make sure you are staying hydrated getting plenty of rest.  Your urinalysis does not reveal any signs of urinary tract infection today.  Urine pregnancy was negative.   Your swab results were return over the next few days and someone will call if results are positive and require any additional treatment. Follow-up with your primary care provider or return here as needed.

## 2024-06-22 NOTE — ED Provider Notes (Signed)
 [facility]    CSN: [id number] Arrival date & time: 06/22/24  1036      History   Chief Complaint Chief Complaint  Patient presents with   Cough   Nasal Congestion   SEXUALLY TRANSMITTED DISEASE   Vaginal Discharge    HPI Teresa Alexander is a 23 y.o. female.   Patient presents today with multiple complaints.  Patient reports that she has had cough and congestion for about 3 days.  Denies fever, body aches, chills, chest pain, shortness of breath, nausea, vomiting, and diarrhea.  Patient reports that she has been eating honey and cough drops with some relief.  Patient denies any known sick contacts.  Patient is also requesting STD testing as she has had some increase in vaginal discharge.  Patient states that seem to be vaginal discharge is worse in the morning.  Patient denies any vaginal pain, itching, lesions, or abnormal vaginal bleeding.  LMP 8/7.  Denies any known exposures to STDs.  Patient would also like a test of her urine today.  Patient states that her urine appears to be more yellow than normal.  Patient states that she drinks a lot of water and therefore feels like her urine should be more clear than it is.  Patient denies dysuria, hematuria, urinary frequency/urgency, abdominal pain, and flank pain.  The history is provided by the patient and medical records.  Cough Vaginal Discharge   History reviewed. No pertinent past medical history.  There are no active problems to display for this patient.   History reviewed. No pertinent surgical history.  OB History   No obstetric history on file.      Home Medications    Prior to Admission medications   Medication Sig Start Date End Date Taking? Authorizing Provider  clotrimazole  (LOTRIMIN ) 1 % cream Apply to chest area 2 times daily 03/19/24   Dreama, [place]  N, FNP  triamcinolone  cream (KENALOG ) 0.1 % Apply 1 Application topically 2 (two) times daily. Apply to knee area 2x daily for 7 days  03/19/24   Dreama Todd SAILOR, FNP    Family History Family History  Problem Relation Age of Onset   Healthy Mother     Social History Social History   Tobacco Use   Smoking status: Never   Smokeless tobacco: Never  Vaping Use   Vaping status: Every Day   Substances: Nicotine, Flavoring  Substance Use Topics   Alcohol use: Yes    Comment: occasionally   Drug use: Yes    Types: Marijuana     Allergies   Patient has no known allergies.   Review of Systems Review of Systems  Respiratory:  Positive for cough.   Genitourinary:  Positive for vaginal discharge.   Per HPI  Physical Exam Triage Vital Signs ED Triage Vitals  Encounter Vitals Group     BP 06/22/24 1113 110/74     Girls Systolic BP Percentile --      Girls Diastolic BP Percentile --      Boys Systolic BP Percentile --      Boys Diastolic BP Percentile --      Pulse Rate 06/22/24 1113 85     Resp 06/22/24 1113 16     Temp 06/22/24 1113 98.4 F (36.9 C)     Temp Source 06/22/24 1113 Oral     SpO2 06/22/24 1113 96 %     Weight --      Height --      Head Circumference --  Peak Flow --      Pain Score 06/22/24 1111 0     Pain Loc --      Pain Education --      Exclude from Growth Chart --    No data found.  Updated Vital Signs BP 110/74 (BP Location: Left Arm)   Pulse 85   Temp 98.4 F (36.9 C) (Oral)   Resp 16   LMP 05/27/2024 (Approximate)   SpO2 96%   Visual Acuity Right Eye Distance:   Left Eye Distance:   Bilateral Distance:    Right Eye Near:   Left Eye Near:    Bilateral Near:     Physical Exam Vitals and nursing note reviewed.  Constitutional:      General: She is awake. She is not in acute distress.    Appearance: Normal appearance. She is well-developed and well-groomed. She is not ill-appearing.  HENT:     Right Ear: Tympanic membrane, ear canal and external ear normal.     Left Ear: Tympanic membrane, ear canal and external ear normal.     Nose: Congestion and  rhinorrhea present.     Mouth/Throat:     Mouth: Mucous membranes are moist.     Pharynx: Posterior oropharyngeal erythema and postnasal drip present. No oropharyngeal exudate.  Cardiovascular:     Rate and Rhythm: Normal rate and regular rhythm.  Pulmonary:     Effort: Pulmonary effort is normal.     Breath sounds: Normal breath sounds.  Genitourinary:    Comments: Exam deferred Skin:    General: Skin is warm and dry.  Neurological:     Mental Status: She is alert.  Psychiatric:        Behavior: Behavior is cooperative.      UC Treatments / Results  Labs (all labs ordered are listed, but only abnormal results are displayed) Labs Reviewed  POCT URINALYSIS DIP (MANUAL ENTRY) - Abnormal; Notable for the following components:      Result Value   Color, UA yellow (*)    All other components within normal limits  POCT URINE PREGNANCY  POC COVID19/FLU A&B COMBO  CERVICOVAGINAL ANCILLARY ONLY    EKG   Radiology No results found.  Procedures Procedures (including critical care time)  Medications Ordered in UC Medications - No data to display  Initial Impression / Assessment and Plan / UC Course  I have reviewed the triage vital signs and the nursing notes.  Pertinent labs & imaging results that were available during my care of the patient were reviewed by me and considered in my medical decision making (see chart for details).     Patient is overall well-appearing.  Vitals are stable.    Viral URI with cough Congestion and rhinorrhea are present, mild erythema and PND noted to posterior oropharynx.  Lungs clear bilaterally disposition. COVID and flu testing negative.  Symptoms likely viral in nature.  Discussed over-the-counter medication for symptoms.  2.  Vaginal discharge Urinalysis unremarkable, UPT negative.  GU exam deferred.  Patient perform self swab for STD/STI.  HIV and RPR declined.  Discussed follow-up and return precautions. Final Clinical  Impressions(s) / UC Diagnoses   Final diagnoses:  Acute cough  Vaginal discharge  Viral respiratory illness     Discharge Instructions      COVID and flu testing were negative today.  I believe your symptoms are likely related to a viral respiratory illness. You can take over-the-counter Mucinex as needed for cough congestion. Alternate between  650 mg of Tylenol  and 400 mg of ibuprofen  every 6-8 hours as needed for pain or any fever. Make sure you are staying hydrated getting plenty of rest.  Your urinalysis does not reveal any signs of urinary tract infection today.  Urine pregnancy was negative.   Your swab results were return over the next few days and someone will call if results are positive and require any additional treatment. Follow-up with your primary care provider or return here as needed.     ED Prescriptions   None    PDMP not reviewed this encounter.   Johnie Flaming A, NP 06/22/24 [phone]

## 2024-06-22 NOTE — ED Triage Notes (Signed)
 Pt states she has a cough and congestion X 3 days. She has been eating honey and cough drops.    She would like a STI cyto only today she has some vaginal discharge that's worse in the morning.   She wants her pee tested states it is yellow but she drinks a lot of water.

## 2024-06-23 ENCOUNTER — Ambulatory Visit (HOSPITAL_COMMUNITY): Payer: Self-pay

## 2024-06-23 LAB — CERVICOVAGINAL ANCILLARY ONLY
Bacterial Vaginitis (gardnerella): NEGATIVE
Candida Glabrata: NEGATIVE
Candida Vaginitis: POSITIVE — AB
Chlamydia: NEGATIVE
Comment: NEGATIVE
Comment: NEGATIVE
Comment: NEGATIVE
Comment: NEGATIVE
Comment: NEGATIVE
Comment: NORMAL
Neisseria Gonorrhea: NEGATIVE
Trichomonas: NEGATIVE

## 2024-06-23 MED ORDER — FLUCONAZOLE 150 MG PO TABS
150.0000 mg | ORAL_TABLET | Freq: Once | ORAL | 0 refills | Status: AC
Start: 2024-06-23 — End: 2024-06-23

## 2024-06-24 NOTE — Telephone Encounter (Signed)
 Informed by front desk staff: hello. there above pt called regarding test results. pt contact number [phone].     Patient informed of test results listed below. Informed that medication sent to [facility] on [address]. Patient verbalized understanding.

## 2024-08-20 ENCOUNTER — Encounter (HOSPITAL_COMMUNITY): Payer: Self-pay

## 2024-08-20 ENCOUNTER — Ambulatory Visit (HOSPITAL_COMMUNITY)
Admission: RE | Admit: 2024-08-20 | Discharge: 2024-08-20 | Disposition: A | Discharge status: Home / Self Care | Source: Ambulatory Visit | Attending: Family Medicine | Admitting: Family Medicine

## 2024-08-20 VITALS — BP 119/75 | HR 86 | Temp 98.1°F | Resp 16

## 2024-08-20 DIAGNOSIS — Z113 Encounter for screening for infections with a predominantly sexual mode of transmission: Secondary | ICD-10-CM | POA: Insufficient documentation

## 2024-08-20 LAB — HIV ANTIBODY (ROUTINE TESTING W REFLEX): HIV Screen 4th Generation wRfx: NONREACTIVE

## 2024-08-20 NOTE — Discharge Instructions (Signed)
 You were seen today for STD screening.  Your swab and blood work will be resulted tomorrow.  You will see these results on mychart and you will be called if anything is positive for treatment.  If you see something on mychart and have questions, and have not gotten a phone call, please call us  directly.

## 2024-08-20 NOTE — ED Provider Notes (Signed)
 [facility]    CSN: [id number] Arrival date & time: 08/20/24  9144      History   Chief Complaint Chief Complaint  Patient presents with   SEXUALLY TRANSMITTED DISEASE    HPI Teresa Alexander is a 23 y.o. female.   Patient is here for STD testing.  Both swab and blood work.  No symptoms, no known exposures.  No other issues today.        History reviewed. No pertinent past medical history.  There are no active problems to display for this patient.   History reviewed. No pertinent surgical history.  OB History   No obstetric history on file.      Home Medications    Prior to Admission medications   Medication Sig Start Date End Date Taking? Authorizing Provider  clotrimazole  (LOTRIMIN ) 1 % cream Apply to chest area 2 times daily 03/19/24   Dreama, [place]  N, FNP  triamcinolone  cream (KENALOG ) 0.1 % Apply 1 Application topically 2 (two) times daily. Apply to knee area 2x daily for 7 days 03/19/24   Dreama Helayne SAILOR, FNP    Family History Family History  Problem Relation Age of Onset   Healthy Mother     Social History Social History   Tobacco Use   Smoking status: Never   Smokeless tobacco: Never  Vaping Use   Vaping status: Every Day   Substances: Nicotine, Flavoring  Substance Use Topics   Alcohol use: Yes    Comment: occasionally   Drug use: Yes    Types: Marijuana     Allergies   Patient has no known allergies.   Review of Systems Review of Systems  Constitutional: Negative.   HENT: Negative.    Respiratory: Negative.    Cardiovascular: Negative.   Gastrointestinal: Negative.   Musculoskeletal: Negative.      Physical Exam Triage Vital Signs ED Triage Vitals  Encounter Vitals Group     BP 08/20/24 0904 119/75     Girls Systolic BP Percentile --      Girls Diastolic BP Percentile --      Boys Systolic BP Percentile --      Boys Diastolic BP Percentile --      Pulse Rate 08/20/24 0904 86     Resp 08/20/24  0904 16     Temp 08/20/24 0904 98.1 F (36.7 C)     Temp Source 08/20/24 0904 Oral     SpO2 08/20/24 0904 98 %     Weight --      Height --      Head Circumference --      Peak Flow --      Pain Score 08/20/24 0903 0     Pain Loc --      Pain Education --      Exclude from Growth Chart --    No data found.  Updated Vital Signs BP 119/75 (BP Location: Left Arm)   Pulse 86   Temp 98.1 F (36.7 C) (Oral)   Resp 16   LMP 08/16/2024 (Approximate)   SpO2 98%   Visual Acuity Right Eye Distance:   Left Eye Distance:   Bilateral Distance:    Right Eye Near:   Left Eye Near:    Bilateral Near:     Physical Exam Constitutional:      General: She is not in acute distress.    Appearance: Normal appearance. She is normal weight. She is not ill-appearing or toxic-appearing.  Cardiovascular:  Rate and Rhythm: Normal rate and regular rhythm.  Pulmonary:     Effort: Pulmonary effort is normal.     Breath sounds: Normal breath sounds.  Neurological:     General: No focal deficit present.     Mental Status: She is alert.  Psychiatric:        Mood and Affect: Mood normal.      UC Treatments / Results  Labs (all labs ordered are listed, but only abnormal results are displayed) Labs Reviewed  RPR  HIV ANTIBODY (ROUTINE TESTING W REFLEX)  CERVICOVAGINAL ANCILLARY ONLY    EKG   Radiology No results found.  Procedures Procedures (including critical care time)  Medications Ordered in UC Medications - No data to display  Initial Impression / Assessment and Plan / UC Course  I have reviewed the triage vital signs and the nursing notes.  Pertinent labs & imaging results that were available during my care of the patient were reviewed by me and considered in my medical decision making (see chart for details).    Final Clinical Impressions(s) / UC Diagnoses   Final diagnoses:  Screening for STD (sexually transmitted disease)     Discharge Instructions       You were seen today for STD screening.  Your swab and blood work will be resulted tomorrow.  You will see these results on mychart and you will be called if anything is positive for treatment.  If you see something on mychart and have questions, and have not gotten a phone call, please call us  directly.     ED Prescriptions   None    PDMP not reviewed this encounter.   Darral Longs, MD 08/20/24 [phone]

## 2024-08-20 NOTE — ED Triage Notes (Signed)
 Pt state she wants to be tested for STD's.  States she would like a swab and blood work. Denies any symptoms.

## 2024-08-21 LAB — RPR: RPR Ser Ql: NONREACTIVE

## 2024-08-23 LAB — CERVICOVAGINAL ANCILLARY ONLY
Chlamydia: NEGATIVE
Comment: NEGATIVE
Comment: NEGATIVE
Comment: NORMAL
Neisseria Gonorrhea: NEGATIVE
Trichomonas: NEGATIVE

## 2024-09-09 ENCOUNTER — Ambulatory Visit (HOSPITAL_COMMUNITY)
